# Patient Record
Sex: Male | Born: 1955 | Race: White | Hispanic: No | Marital: Married | Smoking: Never smoker
Health system: Southern US, Community
[De-identification: ages and names within clinical notes are randomized; demographics above are authoritative.]

## PROBLEM LIST (undated history)

## (undated) DIAGNOSIS — K219 Gastro-esophageal reflux disease without esophagitis: Secondary | ICD-10-CM

## (undated) DIAGNOSIS — M1990 Unspecified osteoarthritis, unspecified site: Secondary | ICD-10-CM

## (undated) DIAGNOSIS — K4090 Unilateral inguinal hernia, without obstruction or gangrene, not specified as recurrent: Secondary | ICD-10-CM

## (undated) DIAGNOSIS — Z973 Presence of spectacles and contact lenses: Secondary | ICD-10-CM

## (undated) HISTORY — PX: INGUINAL HERNIA REPAIR: SUR1180

---

## 1999-11-23 ENCOUNTER — Encounter: Payer: Self-pay | Admitting: Family Medicine

## 1999-11-23 ENCOUNTER — Ambulatory Visit (HOSPITAL_COMMUNITY): Admission: RE | Admit: 1999-11-23 | Discharge: 1999-11-23 | Payer: Self-pay | Admitting: Family Medicine

## 2015-06-24 NOTE — H&P (Signed)
TOTAL HIP ADMISSION H&P  Patient is admitted for left total hip arthroplasty, anterior approach.  Subjective:  Chief Complaint:    Left hip primary OA / pain  HPI: Dale Wagner, 60 y.o. male, has a history of pain and functional disability in the left hip(s) due to arthritis and patient has failed non-surgical conservative treatments for greater than 12 weeks to include NSAID's and/or analgesics and activity modification.  Onset of symptoms was gradual starting ~2 years ago with gradually worsening course since that time.The patient noted no past surgery on the left hip(s).  Patient currently rates pain in the left hip at 10 out of 10 with activity. Patient has night pain, worsening of pain with activity and weight bearing, trendelenberg gait, pain that interfers with activities of daily living and pain with passive range of motion. Patient has evidence of periarticular osteophytes and joint space narrowing by imaging studies. This condition presents safety issues increasing the risk of falls.   There is no current active infection.   Risks, benefits and expectations were discussed with the patient.  Risks including but not limited to the risk of anesthesia, blood clots, nerve damage, blood vessel damage, failure of the prosthesis, infection and up to and including death.  Patient understand the risks, benefits and expectations and wishes to proceed with surgery.   PCP: Lolita Patella, MD  D/C Plans:      Home with HHPT  Post-op Meds:       No Rx given  Tranexamic Acid:      To be given - IV   Decadron:      Is to be given  FYI:     ASA  Norco    Past Medical History  Diagnosis Date  . Arthritis   . GERD (gastroesophageal reflux disease)     Past Surgical History  Procedure Laterality Date  . Hernia repair  2000    No prescriptions prior to admission   No Known Allergies   Social History  Substance Use Topics  . Smoking status: Never Smoker   . Smokeless tobacco:  Not on file  . Alcohol Use: No       Review of Systems  Constitutional: Negative.   HENT: Negative.   Eyes: Negative.   Respiratory: Negative.   Cardiovascular: Negative.   Gastrointestinal: Positive for heartburn.  Genitourinary: Positive for frequency.  Musculoskeletal: Positive for joint pain.  Skin: Negative.   Neurological: Negative.   Endo/Heme/Allergies: Negative.   Psychiatric/Behavioral: Negative.     Objective:  Physical Exam  Constitutional: He is oriented to person, place, and time. He appears well-developed.  HENT:  Head: Normocephalic.  Eyes: Pupils are equal, round, and reactive to light.  Neck: Neck supple. No JVD present. No tracheal deviation present. No thyromegaly present.  Cardiovascular: Normal rate, regular rhythm, normal heart sounds and intact distal pulses.   Respiratory: Effort normal and breath sounds normal. No stridor. No respiratory distress. He has no wheezes.  GI: Soft. There is no tenderness. There is no guarding.  Musculoskeletal:       Left hip: He exhibits decreased range of motion, decreased strength, tenderness and bony tenderness. He exhibits no swelling, no deformity and no laceration.  Lymphadenopathy:    He has no cervical adenopathy.  Neurological: He is alert and oriented to person, place, and time.  Skin: Skin is warm and dry.  Psychiatric: He has a normal mood and affect.      Imaging Review Plain radiographs demonstrate severe  degenerative joint disease of the left hip(s). The bone quality appears to be good for age and reported activity level.  Assessment/Plan:  End stage arthritis, left hip(s)  The patient history, physical examination, clinical judgement of the provider and imaging studies are consistent with end stage degenerative joint disease of the left hip(s) and total hip arthroplasty is deemed medically necessary. The treatment options including medical management, injection therapy, arthroscopy and  arthroplasty were discussed at length. The risks and benefits of total hip arthroplasty were presented and reviewed. The risks due to aseptic loosening, infection, stiffness, dislocation/subluxation,  thromboembolic complications and other imponderables were discussed.  The patient acknowledged the explanation, agreed to proceed with the plan and consent was signed. Patient is being admitted for inpatient treatment for surgery, pain control, PT, OT, prophylactic antibiotics, VTE prophylaxis, progressive ambulation and ADL's and discharge planning.The patient is planning to be discharged home with home health services.      Anastasio Auerbach [place] Paiz   PA-C  07/04/2015, 2:58 PM

## 2015-06-30 NOTE — Patient Instructions (Addendum)
YOUR PROCEDURE IS SCHEDULED ON :  07/12/15  REPORT TO [facility] MAIN ENTRANCE FOLLOW SIGNS TO EAST ELEVATOR - GO TO 3rd FLOOR CHECK IN AT 3 EAST NURSES STATION (SHORT STAY) AT:  5:45 AM  CALL THIS NUMBER IF YOU HAVE PROBLEMS THE MORNING OF SURGERY [phone]  REMEMBER:ONLY 1 PER PERSON MAY GO TO SHORT STAY WITH YOU TO GET READY THE MORNING OF YOUR SURGERY  DO NOT EAT FOOD OR DRINK LIQUIDS AFTER MIDNIGHT  TAKE THESE MEDICINES THE MORNING OF SURGERY: NONE  YOU MAY NOT HAVE ANY METAL ON YOUR BODY INCLUDING HAIR PINS AND PIERCING'S. DO NOT WEAR JEWELRY, MAKEUP, LOTIONS, POWDERS OR PERFUMES. DO NOT WEAR NAIL POLISH. DO NOT SHAVE 48 HRS PRIOR TO SURGERY. MEN MAY SHAVE FACE AND NECK.  DO NOT BRING VALUABLES TO HOSPITAL. [name] IS NOT RESPONSIBLE FOR VALUABLES.  CONTACTS, DENTURES OR PARTIALS MAY NOT BE WORN TO SURGERY. LEAVE SUITCASE IN CAR. CAN BE BROUGHT TO ROOM AFTER SURGERY.  PATIENTS DISCHARGED THE DAY OF SURGERY WILL NOT BE ALLOWED TO DRIVE HOME.  PLEASE READ OVER THE FOLLOWING INSTRUCTION SHEETS _________________________________________________________________________________                                          [name] - PREPARING FOR SURGERY  Before surgery, you can play an important role.  Because skin is not sterile, your skin needs to be as free of germs as possible.  You can reduce the number of germs on your skin by washing with CHG (chlorahexidine gluconate) soap before surgery.  CHG is an antiseptic cleaner which kills germs and bonds with the skin to continue killing germs even after washing. Please DO NOT use if you have an allergy to CHG or antibacterial soaps.  If your skin becomes reddened/irritated stop using the CHG and inform your nurse when you arrive at Short Stay. Do not shave (including legs and underarms) for at least 48 hours prior to the first CHG shower.  You may shave your face. Please follow these instructions  carefully:   1.  Shower with CHG Soap the night before surgery and the  morning of Surgery.   2.  If you choose to wash your hair, wash your hair first as usual with your  normal  Shampoo.   3.  After you shampoo, rinse your hair and body thoroughly to remove the  shampoo.                                         4.  Use CHG as you would any other liquid soap.  You can apply chg directly  to the skin and wash . Gently wash with scrungie or clean wascloth    5.  Apply the CHG Soap to your body ONLY FROM THE NECK DOWN.   Do not use on open                           Wound or open sores. Avoid contact with eyes, ears mouth and genitals (private parts).                        Genitals (private parts) with your normal soap.  6.  Wash thoroughly, paying special attention to the area where your surgery  will be performed.   7.  Thoroughly rinse your body with warm water from the neck down.   8.  DO NOT shower/wash with your normal soap after using and rinsing off  the CHG Soap .                9.  Pat yourself dry with a clean towel.             10.  Wear clean night clothes to bed after shower             11.  Place clean sheets on your bed the night of your first shower and do not  sleep with pets.  Day of Surgery : Do not apply any lotions/deodorants the morning of surgery.  Please wear clean clothes to the hospital/surgery center.  FAILURE TO FOLLOW THESE INSTRUCTIONS MAY RESULT IN THE CANCELLATION OF YOUR SURGERY    PATIENT SIGNATURE_________________________________  ______________________________________________________________________     Dale Wagner  An incentive spirometer is a tool that can help keep your lungs clear and active. This tool measures how well you are filling your lungs with each breath. Taking long deep breaths may help reverse or decrease the chance of developing breathing (pulmonary) problems (especially infection) following:  A long  period of time when you are unable to move or be active. BEFORE THE PROCEDURE   If the spirometer includes an indicator to show your best effort, your nurse or respiratory therapist will set it to a desired goal.  If possible, sit up straight or lean slightly forward. Try not to slouch.  Hold the incentive spirometer in an upright position. INSTRUCTIONS FOR USE   Sit on the edge of your bed if possible, or sit up as far as you can in bed or on a chair.  Hold the incentive spirometer in an upright position.  Breathe out normally.  Place the mouthpiece in your mouth and seal your lips tightly around it.  Breathe in slowly and as deeply as possible, raising the piston or the ball toward the top of the column.  Hold your breath for 3-5 seconds or for as long as possible. Allow the piston or ball to fall to the bottom of the column.  Remove the mouthpiece from your mouth and breathe out normally.  Rest for a few seconds and repeat Steps 1 through 7 at least 10 times every 1-2 hours when you are awake. Take your time and take a few normal breaths between deep breaths.  The spirometer may include an indicator to show your best effort. Use the indicator as a goal to work toward during each repetition.  After each set of 10 deep breaths, practice coughing to be sure your lungs are clear. If you have an incision (the cut made at the time of surgery), support your incision when coughing by placing a pillow or rolled up towels firmly against it. Once you are able to get out of bed, walk around indoors and cough well. You may stop using the incentive spirometer when instructed by your caregiver.  RISKS AND COMPLICATIONS  Take your time so you do not get dizzy or light-headed.  If you are in pain, you may need to take or ask for pain medication before doing incentive spirometry. It is harder to take a deep breath if you are having pain. AFTER USE  Rest and breathe slowly and easily.  It can  be helpful to keep track of a log of your progress. Your caregiver can provide you with a simple table to help with this. If you are using the spirometer at home, follow these instructions: Dale Wagner IF:   You are having difficultly using the spirometer.  You have trouble using the spirometer as often as instructed.  Your pain medication is not giving enough relief while using the spirometer.  You develop fever of 100.5 F (38.1 C) or higher. SEEK IMMEDIATE MEDICAL CARE IF:   You cough up bloody sputum that had not been present before.  You develop fever of 102 F (38.9 C) or greater.  You develop worsening pain at or near the incision site. MAKE SURE YOU:   Understand these instructions.  Will watch your condition.  Will get help right away if you are not doing well or get worse. Document Released: 10/01/2006 Document Revised: 08/13/2011 Document Reviewed: 12/02/2006 [employer] Patient Information 2014 [employer], [place].   ________________________________________________________________________  WHAT IS A BLOOD TRANSFUSION? Blood Transfusion Information  A transfusion is the replacement of blood or some of its parts. Blood is made up of multiple cells which provide different functions.  Red blood cells carry oxygen and are used for blood loss replacement.  White blood cells fight against infection.  Platelets control bleeding.  Plasma helps clot blood.  Other blood products are available for specialized needs, such as hemophilia or other clotting disorders. BEFORE THE TRANSFUSION  Who gives blood for transfusions?   Healthy volunteers who are fully evaluated to make sure their blood is safe. This is blood bank blood. Transfusion therapy is the safest it has ever been in the practice of medicine. Before blood is taken from a donor, a complete history is taken to make sure that person has no history of diseases nor engages in risky social behavior (examples are  intravenous drug use or sexual activity with multiple partners). The donor's travel history is screened to minimize risk of transmitting infections, such as malaria. The donated blood is tested for signs of infectious diseases, such as HIV and hepatitis. The blood is then tested to be sure it is compatible with you in order to minimize the chance of a transfusion reaction. If you or a relative donates blood, this is often done in anticipation of surgery and is not appropriate for emergency situations. It takes many days to process the donated blood. RISKS AND COMPLICATIONS Although transfusion therapy is very safe and saves many lives, the main dangers of transfusion include:   Getting an infectious disease.  Developing a transfusion reaction. This is an allergic reaction to something in the blood you were given. Every precaution is taken to prevent this. The decision to have a blood transfusion has been considered carefully by your caregiver before blood is given. Blood is not given unless the benefits outweigh the risks. AFTER THE TRANSFUSION  Right after receiving a blood transfusion, you will usually feel much better and more energetic. This is especially true if your red blood cells have gotten low (anemic). The transfusion raises the level of the red blood cells which carry oxygen, and this usually causes an energy increase.  The nurse administering the transfusion will monitor you carefully for complications. HOME CARE INSTRUCTIONS  No special instructions are needed after a transfusion. You may find your energy is better. Speak with your caregiver about any limitations on activity for underlying diseases you may have. SEEK MEDICAL CARE IF:   Your  condition is not improving after your transfusion.  You develop redness or irritation at the intravenous (IV) site. SEEK IMMEDIATE MEDICAL CARE IF:  Any of the following symptoms occur over the next 12 hours:  Shaking chills.  You have a  temperature by mouth above 102 F (38.9 C), not controlled by medicine.  Chest, back, or muscle pain.  People around you feel you are not acting correctly or are confused.  Shortness of breath or difficulty breathing.  Dizziness and fainting.  You get a rash or develop hives.  You have a decrease in urine output.  Your urine turns a dark color or changes to pink, red, or brown. Any of the following symptoms occur over the next 10 days:  You have a temperature by mouth above 102 F (38.9 C), not controlled by medicine.  Shortness of breath.  Weakness after normal activity.  The white part of the eye turns yellow (jaundice).  You have a decrease in the amount of urine or are urinating less often.  Your urine turns a dark color or changes to pink, red, or brown. Document Released: 05/18/2000 Document Revised: 08/13/2011 Document Reviewed: 01/05/2008 [facility] Patient Information 2014 [place], [place].  _______________________________________________________________________

## 2015-07-04 ENCOUNTER — Encounter (HOSPITAL_COMMUNITY)
Admission: RE | Admit: 2015-07-04 | Discharge: 2015-07-04 | Disposition: A | Discharge status: Home / Self Care | Payer: Commercial Managed Care - PPO | Source: Ambulatory Visit | Attending: Orthopedic Surgery | Admitting: Orthopedic Surgery

## 2015-07-04 ENCOUNTER — Encounter (HOSPITAL_COMMUNITY): Payer: Self-pay

## 2015-07-04 DIAGNOSIS — Z01812 Encounter for preprocedural laboratory examination: Secondary | ICD-10-CM | POA: Diagnosis present

## 2015-07-04 DIAGNOSIS — M1612 Unilateral primary osteoarthritis, left hip: Secondary | ICD-10-CM | POA: Diagnosis not present

## 2015-07-04 DIAGNOSIS — Z0183 Encounter for blood typing: Secondary | ICD-10-CM | POA: Diagnosis not present

## 2015-07-04 HISTORY — DX: Gastro-esophageal reflux disease without esophagitis: K21.9

## 2015-07-04 HISTORY — DX: Unspecified osteoarthritis, unspecified site: M19.90

## 2015-07-04 LAB — CBC
HCT: 41 % (ref 39.0–52.0)
Hemoglobin: 13.7 g/dL (ref 13.0–17.0)
MCH: 30.4 pg (ref 26.0–34.0)
MCHC: 33.4 g/dL (ref 30.0–36.0)
MCV: 91.1 fL (ref 78.0–100.0)
PLATELETS: 167 10*3/uL (ref 150–400)
RBC: 4.5 MIL/uL (ref 4.22–5.81)
RDW: 12.8 % (ref 11.5–15.5)
WBC: 6.3 10*3/uL (ref 4.0–10.5)

## 2015-07-04 LAB — BASIC METABOLIC PANEL
Anion gap: 7 (ref 5–15)
BUN: 20 mg/dL (ref 6–20)
CALCIUM: 9 mg/dL (ref 8.9–10.3)
CHLORIDE: 107 mmol/L (ref 101–111)
CO2: 26 mmol/L (ref 22–32)
CREATININE: 0.96 mg/dL (ref 0.61–1.24)
GFR calc non Af Amer: >60 mL/min (ref >60)
Glucose, Bld: 86 mg/dL (ref 65–99)
Potassium: 4.6 mmol/L (ref 3.5–5.1)
SODIUM: 140 mmol/L (ref 135–145)

## 2015-07-04 LAB — URINE MICROSCOPIC-ADD ON: WBC UA: NONE SEEN WBC/hpf (ref 0–5)

## 2015-07-04 LAB — URINALYSIS, ROUTINE W REFLEX MICROSCOPIC
Bilirubin Urine: NEGATIVE
Glucose, UA: NEGATIVE mg/dL
Ketones, ur: NEGATIVE mg/dL
Leukocytes, UA: NEGATIVE
NITRITE: NEGATIVE
PH: 5.5 (ref 5.0–8.0)
Protein, ur: NEGATIVE mg/dL
SPECIFIC GRAVITY, URINE: 1.009 (ref 1.005–1.030)

## 2015-07-04 LAB — SURGICAL PCR SCREEN
MRSA, PCR: NEGATIVE
STAPHYLOCOCCUS AUREUS: NEGATIVE

## 2015-07-04 LAB — PROTIME-INR
INR: 1.11 (ref 0.00–1.49)
PROTHROMBIN TIME: 14.1 s (ref 11.6–15.2)

## 2015-07-04 LAB — APTT: aPTT: 31 seconds (ref 24–37)

## 2015-07-04 LAB — ABO/RH: ABO/RH(D): A POS

## 2015-07-12 ENCOUNTER — Encounter (HOSPITAL_COMMUNITY)
Admission: RE | Disposition: A | Discharge status: Home Health | Payer: Self-pay | Source: Ambulatory Visit | Attending: Orthopedic Surgery

## 2015-07-12 ENCOUNTER — Inpatient Hospital Stay (HOSPITAL_COMMUNITY): Payer: Commercial Managed Care - PPO

## 2015-07-12 ENCOUNTER — Encounter (HOSPITAL_COMMUNITY): Payer: Self-pay | Admitting: Registered Nurse

## 2015-07-12 ENCOUNTER — Inpatient Hospital Stay (HOSPITAL_COMMUNITY): Payer: Commercial Managed Care - PPO | Admitting: Registered Nurse

## 2015-07-12 ENCOUNTER — Inpatient Hospital Stay (HOSPITAL_COMMUNITY)
Admission: RE | Admit: 2015-07-12 | Discharge: 2015-07-13 | DRG: 470 | Disposition: A | Discharge status: Home Health | Payer: Commercial Managed Care - PPO | Source: Ambulatory Visit | Attending: Orthopedic Surgery | Admitting: Orthopedic Surgery

## 2015-07-12 DIAGNOSIS — Z6834 Body mass index (BMI) 34.0-34.9, adult: Secondary | ICD-10-CM

## 2015-07-12 DIAGNOSIS — M1612 Unilateral primary osteoarthritis, left hip: Secondary | ICD-10-CM | POA: Diagnosis present

## 2015-07-12 DIAGNOSIS — Z96649 Presence of unspecified artificial hip joint: Secondary | ICD-10-CM

## 2015-07-12 DIAGNOSIS — E669 Obesity, unspecified: Secondary | ICD-10-CM | POA: Diagnosis present

## 2015-07-12 DIAGNOSIS — K219 Gastro-esophageal reflux disease without esophagitis: Secondary | ICD-10-CM | POA: Diagnosis present

## 2015-07-12 DIAGNOSIS — Z01812 Encounter for preprocedural laboratory examination: Secondary | ICD-10-CM | POA: Diagnosis not present

## 2015-07-12 DIAGNOSIS — M25552 Pain in left hip: Secondary | ICD-10-CM | POA: Diagnosis present

## 2015-07-12 HISTORY — PX: TOTAL HIP ARTHROPLASTY: SHX124

## 2015-07-12 LAB — TYPE AND SCREEN
ABO/RH(D): A POS
ANTIBODY SCREEN: NEGATIVE

## 2015-07-12 SURGERY — ARTHROPLASTY, HIP, TOTAL, ANTERIOR APPROACH
Anesthesia: Spinal | Site: Hip | Laterality: Left

## 2015-07-12 MED ORDER — ALUM & MAG HYDROXIDE-SIMETH 200-200-20 MG/5ML PO SUSP: 30.0000 mL | ORAL | Status: DC | PRN

## 2015-07-12 MED ORDER — MIDAZOLAM HCL 5 MG/5ML IJ SOLN
INTRAMUSCULAR | Status: DC | PRN
  Administered 2015-07-12: 2 mg via INTRAVENOUS

## 2015-07-12 MED ORDER — CELECOXIB 200 MG PO CAPS
200.0000 mg | ORAL_CAPSULE | Freq: Two times a day (BID) | ORAL | Status: DC
  Administered 2015-07-12 – 2015-07-13 (×3): 200 mg via ORAL
  Filled 2015-07-12 (×4): qty 1

## 2015-07-12 MED ORDER — FERROUS SULFATE 325 (65 FE) MG PO TABS
325.0000 mg | ORAL_TABLET | Freq: Three times a day (TID) | ORAL | Status: DC
  Administered 2015-07-12 – 2015-07-13 (×4): 325 mg via ORAL
  Filled 2015-07-12 (×6): qty 1

## 2015-07-12 MED ORDER — PROPOFOL 10 MG/ML IV BOLUS
INTRAVENOUS | Status: DC | PRN
  Administered 2015-07-12: 30 mg via INTRAVENOUS

## 2015-07-12 MED ORDER — STERILE WATER FOR IRRIGATION IR SOLN
Status: DC | PRN
  Administered 2015-07-12: 1000 mL

## 2015-07-12 MED ORDER — SODIUM CHLORIDE 0.9 % IR SOLN
Status: DC | PRN
  Administered 2015-07-12: 1000 mL

## 2015-07-12 MED ORDER — MAGNESIUM CITRATE PO SOLN
1.0000 | Freq: Once | ORAL | Status: DC | PRN
Start: 2015-07-12 — End: 2015-07-13

## 2015-07-12 MED ORDER — CEFAZOLIN SODIUM-DEXTROSE 2-3 GM-% IV SOLR
2.0000 g | INTRAVENOUS | Status: AC
  Administered 2015-07-12: 2 g via INTRAVENOUS

## 2015-07-12 MED ORDER — PROPOFOL 500 MG/50ML IV EMUL
INTRAVENOUS | Status: DC | PRN
  Administered 2015-07-12: 100 ug/kg/min via INTRAVENOUS

## 2015-07-12 MED ORDER — HYDROMORPHONE HCL 1 MG/ML IJ SOLN: 0.2500 mg | INTRAMUSCULAR | Status: DC | PRN

## 2015-07-12 MED ORDER — BUPIVACAINE IN DEXTROSE 0.75-8.25 % IT SOLN
INTRATHECAL | Status: DC | PRN
  Administered 2015-07-12: 2 mL via INTRATHECAL

## 2015-07-12 MED ORDER — OXYCODONE HCL 5 MG PO TABS: 5.0000 mg | ORAL_TABLET | Freq: Once | ORAL | Status: DC | PRN

## 2015-07-12 MED ORDER — ONDANSETRON HCL 4 MG/2ML IJ SOLN: 4.0000 mg | Freq: Four times a day (QID) | INTRAMUSCULAR | Status: DC | PRN

## 2015-07-12 MED ORDER — DOCUSATE SODIUM 100 MG PO CAPS
100.0000 mg | ORAL_CAPSULE | Freq: Two times a day (BID) | ORAL | Status: DC
  Administered 2015-07-12 – 2015-07-13 (×2): 100 mg via ORAL

## 2015-07-12 MED ORDER — HYDROMORPHONE HCL 1 MG/ML IJ SOLN
0.5000 mg | INTRAMUSCULAR | Status: DC | PRN
  Administered 2015-07-12 (×2): 0.5 mg via INTRAVENOUS
  Administered 2015-07-12: 1 mg via INTRAVENOUS
  Filled 2015-07-12 (×2): qty 1

## 2015-07-12 MED ORDER — METOCLOPRAMIDE HCL 10 MG PO TABS: 5.0000 mg | ORAL_TABLET | Freq: Three times a day (TID) | ORAL | Status: DC | PRN

## 2015-07-12 MED ORDER — METHOCARBAMOL 1000 MG/10ML IJ SOLN
500.0000 mg | Freq: Four times a day (QID) | INTRAVENOUS | Status: DC | PRN
  Administered 2015-07-12: 500 mg via INTRAVENOUS
  Filled 2015-07-12 (×2): qty 5

## 2015-07-12 MED ORDER — ASPIRIN EC 325 MG PO TBEC
325.0000 mg | DELAYED_RELEASE_TABLET | Freq: Two times a day (BID) | ORAL | Status: DC
  Administered 2015-07-13: 325 mg via ORAL
  Filled 2015-07-12 (×3): qty 1

## 2015-07-12 MED ORDER — FENTANYL CITRATE (PF) 100 MCG/2ML IJ SOLN
INTRAMUSCULAR | Status: AC
  Filled 2015-07-12: qty 2

## 2015-07-12 MED ORDER — DIPHENHYDRAMINE HCL 25 MG PO CAPS
25.0000 mg | ORAL_CAPSULE | Freq: Four times a day (QID) | ORAL | Status: DC | PRN
Start: 2015-07-12 — End: 2015-07-13

## 2015-07-12 MED ORDER — PROPOFOL 10 MG/ML IV BOLUS
INTRAVENOUS | Status: AC
  Filled 2015-07-12: qty 60

## 2015-07-12 MED ORDER — DEXAMETHASONE SODIUM PHOSPHATE 10 MG/ML IJ SOLN
10.0000 mg | Freq: Once | INTRAMUSCULAR | Status: AC
  Administered 2015-07-12: 10 mg via INTRAVENOUS

## 2015-07-12 MED ORDER — SODIUM CHLORIDE 0.9 % IV SOLN
100.0000 mL/h | INTRAVENOUS | Status: DC
  Administered 2015-07-12: 100 mL/h via INTRAVENOUS
  Filled 2015-07-12 (×5): qty 1000

## 2015-07-12 MED ORDER — PHENYLEPHRINE 40 MCG/ML (10ML) SYRINGE FOR IV PUSH (FOR BLOOD PRESSURE SUPPORT)
PREFILLED_SYRINGE | INTRAVENOUS | Status: AC
  Filled 2015-07-12: qty 10

## 2015-07-12 MED ORDER — LACTATED RINGERS IV SOLN
INTRAVENOUS | Status: DC | PRN
  Administered 2015-07-12 (×3): via INTRAVENOUS

## 2015-07-12 MED ORDER — PROPOFOL 10 MG/ML IV BOLUS
INTRAVENOUS | Status: AC
  Filled 2015-07-12: qty 20

## 2015-07-12 MED ORDER — PHENOL 1.4 % MT LIQD
1.0000 | OROMUCOSAL | Status: DC | PRN
Start: 2015-07-12 — End: 2015-07-13
  Filled 2015-07-12: qty 177

## 2015-07-12 MED ORDER — CHLORHEXIDINE GLUCONATE 4 % EX LIQD: 60.0000 mL | Freq: Once | CUTANEOUS | Status: DC

## 2015-07-12 MED ORDER — CEFAZOLIN SODIUM-DEXTROSE 2-3 GM-% IV SOLR
2.0000 g | Freq: Four times a day (QID) | INTRAVENOUS | Status: AC
  Administered 2015-07-12 (×2): 2 g via INTRAVENOUS
  Filled 2015-07-12 (×2): qty 50

## 2015-07-12 MED ORDER — BISACODYL 10 MG RE SUPP: 10.0000 mg | Freq: Every day | RECTAL | Status: DC | PRN

## 2015-07-12 MED ORDER — PHENYLEPHRINE HCL 10 MG/ML IJ SOLN
10.0000 mg | INTRAVENOUS | Status: DC | PRN
  Administered 2015-07-12: 40 ug/min via INTRAVENOUS

## 2015-07-12 MED ORDER — CEFAZOLIN SODIUM-DEXTROSE 2-3 GM-% IV SOLR
INTRAVENOUS | Status: AC
  Filled 2015-07-12: qty 50

## 2015-07-12 MED ORDER — METOCLOPRAMIDE HCL 5 MG/ML IJ SOLN: 5.0000 mg | Freq: Three times a day (TID) | INTRAMUSCULAR | Status: DC | PRN

## 2015-07-12 MED ORDER — PHENYLEPHRINE HCL 10 MG/ML IJ SOLN
INTRAMUSCULAR | Status: AC
  Filled 2015-07-12: qty 1

## 2015-07-12 MED ORDER — FENTANYL CITRATE (PF) 100 MCG/2ML IJ SOLN
INTRAMUSCULAR | Status: DC | PRN
  Administered 2015-07-12: 100 ug via INTRAVENOUS

## 2015-07-12 MED ORDER — OXYCODONE HCL 5 MG/5ML PO SOLN: 5.0000 mg | Freq: Once | ORAL | Status: DC | PRN

## 2015-07-12 MED ORDER — ONDANSETRON HCL 4 MG/2ML IJ SOLN
INTRAMUSCULAR | Status: DC | PRN
  Administered 2015-07-12: 4 mg via INTRAVENOUS

## 2015-07-12 MED ORDER — PHENYLEPHRINE HCL 10 MG/ML IJ SOLN
INTRAMUSCULAR | Status: DC | PRN
  Administered 2015-07-12 (×3): 80 ug via INTRAVENOUS

## 2015-07-12 MED ORDER — HYDROCODONE-ACETAMINOPHEN 7.5-325 MG PO TABS
1.0000 | ORAL_TABLET | ORAL | Status: DC
  Administered 2015-07-12 – 2015-07-13 (×6): 2 via ORAL
  Filled 2015-07-12 (×6): qty 2

## 2015-07-12 MED ORDER — TRANEXAMIC ACID 1000 MG/10ML IV SOLN
1000.0000 mg | Freq: Once | INTRAVENOUS | Status: AC
  Administered 2015-07-12: 1000 mg via INTRAVENOUS
  Filled 2015-07-12: qty 10

## 2015-07-12 MED ORDER — METHOCARBAMOL 500 MG PO TABS: 500.0000 mg | ORAL_TABLET | Freq: Four times a day (QID) | ORAL | Status: DC | PRN

## 2015-07-12 MED ORDER — MIDAZOLAM HCL 2 MG/2ML IJ SOLN
INTRAMUSCULAR | Status: AC
  Filled 2015-07-12: qty 2

## 2015-07-12 MED ORDER — ONDANSETRON HCL 4 MG PO TABS: 4.0000 mg | ORAL_TABLET | Freq: Four times a day (QID) | ORAL | Status: DC | PRN

## 2015-07-12 MED ORDER — POLYETHYLENE GLYCOL 3350 17 G PO PACK
17.0000 g | PACK | Freq: Two times a day (BID) | ORAL | Status: DC
  Administered 2015-07-12 – 2015-07-13 (×3): 17 g via ORAL

## 2015-07-12 MED ORDER — MENTHOL 3 MG MT LOZG: 1.0000 | LOZENGE | OROMUCOSAL | Status: DC | PRN

## 2015-07-12 MED ORDER — MEPERIDINE HCL 50 MG/ML IJ SOLN: 6.2500 mg | INTRAMUSCULAR | Status: DC | PRN

## 2015-07-12 MED ORDER — DEXAMETHASONE SODIUM PHOSPHATE 10 MG/ML IJ SOLN
10.0000 mg | Freq: Once | INTRAMUSCULAR | Status: AC
  Administered 2015-07-13: 10 mg via INTRAVENOUS
  Filled 2015-07-12: qty 1

## 2015-07-12 SURGICAL SUPPLY — 35 items
BAG DECANTER FOR FLEXI CONT (MISCELLANEOUS) IMPLANT
BAG SPEC THK2 15X12 ZIP CLS (MISCELLANEOUS)
BAG ZIPLOCK 12X15 (MISCELLANEOUS) IMPLANT
CAPT HIP TOTAL 2 ×1 IMPLANT
CLOTH BEACON ORANGE TIMEOUT ST (SAFETY) ×2 IMPLANT
COVER PERINEAL POST (MISCELLANEOUS) ×2 IMPLANT
DRAPE STERI IOBAN 125X83 (DRAPES) ×2 IMPLANT
DRAPE U-SHAPE 47X51 STRL (DRAPES) ×4 IMPLANT
DRSG AQUACEL AG ADV 3.5X10 (GAUZE/BANDAGES/DRESSINGS) ×2 IMPLANT
DURAPREP 26ML APPLICATOR (WOUND CARE) ×2 IMPLANT
ELECT REM PT RETURN 15FT ADLT (MISCELLANEOUS) IMPLANT
ELECT REM PT RETURN 9FT ADLT (ELECTROSURGICAL) ×2
ELECTRODE REM PT RTRN 9FT ADLT (ELECTROSURGICAL) ×1 IMPLANT
GLOVE BIOGEL M STRL SZ7.5 (GLOVE) ×2 IMPLANT
GLOVE BIOGEL PI IND STRL 7.5 (GLOVE) ×1 IMPLANT
GLOVE BIOGEL PI IND STRL 8.5 (GLOVE) ×1 IMPLANT
GLOVE BIOGEL PI INDICATOR 7.5 (GLOVE) ×3
GLOVE BIOGEL PI INDICATOR 8.5 (GLOVE) ×1
GLOVE ECLIPSE 8.0 STRL XLNG CF (GLOVE) ×4 IMPLANT
GLOVE ORTHO TXT STRL SZ7.5 (GLOVE) ×2 IMPLANT
GLOVE SURG SS PI 7.5 STRL IVOR (GLOVE) ×3 IMPLANT
GOWN STRL REUS W/TWL LRG LVL3 (GOWN DISPOSABLE) ×2 IMPLANT
GOWN STRL REUS W/TWL XL LVL3 (GOWN DISPOSABLE) ×4 IMPLANT
HOLDER FOLEY CATH W/STRAP (MISCELLANEOUS) ×2 IMPLANT
LIQUID BAND (GAUZE/BANDAGES/DRESSINGS) ×2 IMPLANT
PACK ANTERIOR HIP CUSTOM (KITS) ×2 IMPLANT
SAW OSC TIP CART 19.5X105X1.3 (SAW) ×2 IMPLANT
SUT MNCRL AB 4-0 PS2 18 (SUTURE) ×2 IMPLANT
SUT VIC AB 1 CT1 36 (SUTURE) ×6 IMPLANT
SUT VIC AB 2-0 CT1 27 (SUTURE) ×4
SUT VIC AB 2-0 CT1 TAPERPNT 27 (SUTURE) ×2 IMPLANT
SUT VLOC 180 0 24IN GS25 (SUTURE) ×2 IMPLANT
TRAY FOLEY W/METER SILVER 14FR (SET/KITS/TRAYS/PACK) ×1 IMPLANT
WATER STERILE IRR 1500ML POUR (IV SOLUTION) ×2 IMPLANT
YANKAUER SUCT BULB TIP 10FT TU (MISCELLANEOUS) ×1 IMPLANT

## 2015-07-12 NOTE — Progress Notes (Signed)
Utilization review completed.  

## 2015-07-12 NOTE — Op Note (Signed)
NAME:  Dale Wagner                ACCOUNT NO.: [id number]      MEDICAL RECORD NO.: [id number]      FACILITY:  [facility]      PHYSICIAN:  Durene Romans D  DATE OF BIRTH:  11-May-1956     DATE OF PROCEDURE:  07/12/2015                                 OPERATIVE REPORT         PREOPERATIVE DIAGNOSIS: Left  hip osteoarthritis.      POSTOPERATIVE DIAGNOSIS:  Left hip osteoarthritis.      PROCEDURE:  Left total hip replacement through an anterior approach   utilizing DePuy THR system, component size 56mm pinnacle cup, a size 36+4 neutral   Altrex liner, a size 9 Hi Tri Lock stem with a 36+5 delta ceramic   ball.      SURGEON:  Madlyn Frankel. Charlann Boxer, M.D.      ASSISTANT:  Skip Mayer, PA-C      ANESTHESIA:  Spinal.      SPECIMENS:  None.      COMPLICATIONS:  None.      BLOOD LOSS:  450 cc     DRAINS:  None      INDICATION OF THE PROCEDURE:  Dale Wagner is a 60 y.o. male who had   presented to office for evaluation of left hip pain.  Radiographs revealed   progressive degenerative changes with bone-on-bone   articulation to the  hip joint.  The patient had painful limited range of   motion significantly affecting their overall quality of life.  The patient was failing to    respond to conservative measures, and at this point was ready   to proceed with more definitive measures.  The patient has noted progressive   degenerative changes in his hip, progressive problems and dysfunction   with regarding the hip prior to surgery.  Consent was obtained for   benefit of pain relief.  Specific risk of infection, DVT, component   failure, dislocation, need for revision surgery, as well discussion of   the anterior versus posterior approach were reviewed.  Consent was   obtained for benefit of anterior pain relief through an anterior   approach.      PROCEDURE IN DETAIL:  The patient was brought to operative theater.   Once adequate anesthesia, preoperative  antibiotics, 2gm of Ancef, 1 gm of Tranexamic Acid, and 10 mg of Decadron administered.   The patient was positioned supine on the OSI Hanna table.  Once adequate   padding of boney process was carried out, we had predraped out the hip, and  used fluoroscopy to confirm orientation of the pelvis and position.      The left hip was then prepped and draped from proximal iliac crest to   mid thigh with shower curtain technique.      Time-out was performed identifying the patient, planned procedure, and   extremity.     An incision was then made 2 cm distal and lateral to the   anterior superior iliac spine extending over the orientation of the   tensor fascia lata muscle and sharp dissection was carried down to the   fascia of the muscle and protractor placed in the soft tissues.      The fascia  was then incised.  The muscle belly was identified and swept   laterally and retractor placed along the superior neck.  Following   cauterization of the circumflex vessels and removing some pericapsular   fat, a second cobra retractor was placed on the inferior neck.  A third   retractor was placed on the anterior acetabulum after elevating the   anterior rectus.  A L-capsulotomy was along the line of the   superior neck to the trochanteric fossa, then extended proximally and   distally.  Tag sutures were placed and the retractors were then placed   intracapsular.  We then identified the trochanteric fossa and   orientation of my neck cut, confirmed this radiographically   and then made a neck osteotomy with the femur on traction.  The femoral   head was removed without difficulty or complication.  Traction was let   off and retractors were placed posterior and anterior around the   acetabulum.      The labrum and foveal tissue were debrided.  I began reaming with a 47mm   reamer and reamed up to 55mm reamer with good bony bed preparation and a 56mm   cup was chosen.  The final 56mm Pinnacle cup  was then impacted under fluoroscopy  to confirm the depth of penetration and orientation with respect to   abduction.  A screw was placed followed by the hole eliminator.  The final   36+4 neutral Altrex liner was impacted with good visualized rim fit.  The cup was positioned anatomically within the acetabular portion of the pelvis.      At this point, the femur was rolled at 80 degrees.  Further capsule was   released off the inferior aspect of the femoral neck.  I then   released the superior capsule proximally.  The hook was placed laterally   along the femur and elevated manually and held in position with the bed   hook.  The leg was then extended and adducted with the leg rolled to 100   degrees of external rotation.  Once the proximal femur was fully   exposed, I used a box osteotome to set orientation.  I then began   broaching with the starting chili pepper broach and passed this by hand and then broached up to the 9 broach as I was doing interval trial reductions.  With the 9 broach in place I chose a high offset neck and again did several trial reductions.  Pre-operative and intra-operative X-rays indicated significantly shorter left hip compared to the right.  With this stem and offset neck and the +5 I was able to get the lengths close but not all the way to normal as it appeared close to an inch short before.   Given these findings, I went ahead and dislocated the hip, repositioned all   retractors and positioned the right hip in the extended and abducted position.  The final 9 Hi Tri Lock stem was   chosen and it was impacted down to the level of neck cut.  Based on this   and the trial reduction, a 36+5 delta ceramic ball was chosen and   impacted onto a clean and dry trunnion, and the hip was reduced.  The   hip had been irrigated throughout the case again at this point.  I did   reapproximate the superior capsular leaflet to the anterior leaflet   using #1 Vicryl.  The fascia of  the  tensor fascia lata muscle was then reapproximated using #1 Vicryl and #0 V-lock sutures.  The   remaining wound was closed with 2-0 Vicryl and running 4-0 Monocryl.   The hip was cleaned, dried, and dressed sterilely using Dermabond and   Aquacel dressing.  He was then brought   to recovery room in stable condition tolerating the procedure well.    Skip Mayer, PA-C was present for the entirety of the case involved from   preoperative positioning, perioperative retractor management, general   facilitation of the case, as well as primary wound closure as assistant.            Madlyn Frankel Charlann Boxer, M.D.        07/12/2015 10:14 AM

## 2015-07-12 NOTE — Anesthesia Procedure Notes (Signed)
Spinal Patient location during procedure: OR End time: 07/12/2015 8:49 AM Staffing Resident/CRNA: Enrigue Catena E Performed by: resident/CRNA  Preanesthetic Checklist Completed: patient identified, site marked, surgical consent, pre-op evaluation, timeout performed, IV checked, risks and benefits discussed and monitors and equipment checked Spinal Block Patient position: sitting Prep: Betadine Patient monitoring: heart rate, continuous pulse ox and blood pressure Location: L3-4 Injection technique: single-shot Needle Needle type: Spinocan and Sprotte  Needle gauge: 25 G Needle length: 9 cm Assessment Sensory level: T4 Additional Notes Expiration date of kit checked and confirmed. Patient tolerated procedure well, without complications.

## 2015-07-12 NOTE — Discharge Instructions (Signed)

## 2015-07-12 NOTE — Anesthesia Preprocedure Evaluation (Signed)
Anesthesia Evaluation  Patient identified by MRN, date of birth, ID band Patient awake    Reviewed: Allergy & Precautions, NPO status , Patient's Chart, lab work & pertinent test results  Airway Mallampati: I  TM Distance: >3 FB Neck ROM: Full    Dental  (+) Teeth Intact, Dental Advisory Given   Pulmonary    breath sounds clear to auscultation       Cardiovascular  Rhythm:Regular Rate:Normal     Neuro/Psych    GI/Hepatic GERD  Medicated and Controlled,  Endo/Other    Renal/GU      Musculoskeletal  (+) Arthritis ,   Abdominal   Peds  Hematology   Anesthesia Other Findings   Reproductive/Obstetrics                             Anesthesia Physical Anesthesia Plan  ASA: II  Anesthesia Plan: Spinal   Post-op Pain Management:    Induction: Intravenous  Airway Management Planned: Simple Face Mask  Additional Equipment:   Intra-op Plan:   Post-operative Plan:   Informed Consent: I have reviewed the patients History and Physical, chart, labs and discussed the procedure including the risks, benefits and alternatives for the proposed anesthesia with the patient or authorized representative who has indicated his/her understanding and acceptance.   Dental advisory given  Plan Discussed with: CRNA, Anesthesiologist and Surgeon  Anesthesia Plan Comments:         Anesthesia Quick Evaluation

## 2015-07-12 NOTE — Transfer of Care (Signed)
Immediate Anesthesia Transfer of Care Note  Patient: Dale Wagner  Procedure(s) Performed: Procedure(s): LEFT TOTAL HIP ARTHROPLASTY ANTERIOR APPROACH (Left)  Patient Location: PACU  Anesthesia Type:Spinal  Level of Consciousness: sedated  Airway & Oxygen Therapy: Patient Spontanous Breathing and Patient connected to face mask oxygen  Post-op Assessment: Report given to RN and Post -op Vital signs reviewed and stable  Post vital signs: Reviewed and stable  Last Vitals:  Filed Vitals:   07/12/15 0608  BP: 130/76  Pulse: 63  Temp: 36.7 C  Resp: 16    Complications: No apparent anesthesia complications

## 2015-07-12 NOTE — Addendum Note (Signed)
Addendum  created 07/12/15 1207 by Illene Silver, CRNA   Modules edited: Anesthesia LDA, Lines/Drains/Airways Properties Editor   Lines/Drains/Airways Properties Editor:  Properties of line/drain/airway/wound Airway have been modified.

## 2015-07-12 NOTE — Evaluation (Signed)
Physical Therapy Evaluation Patient Details Name: Dale Wagner MRN: [id number] DOB: March 08, 1956 Today's Date: 07/12/2015   History of Present Illness  s/p L DA THA  Clinical Impression  Pt is s/p THA resulting in the deficits listed below (see PT Problem List). * Pt will benefit from skilled PT to increase their independence and safety with mobility to allow discharge to the venue listed below.      Follow Up Recommendations Home health PT    Equipment Recommendations  Rolling walker with 5" wheels;None recommended by PT    Recommendations for Other Services       Precautions / Restrictions Restrictions Weight Bearing Restrictions: No Other Position/Activity Restrictions: WBAT      Mobility  Bed Mobility Overal bed mobility: Needs Assistance Bed Mobility: Supine to Sit     Supine to sit: Supervision     General bed mobility comments: for safety and lines  Transfers Overall transfer level: Needs assistance Equipment used: Rolling walker (2 wheeled) Transfers: Sit to/from Stand Sit to Stand: Min assist         General transfer comment: cues for hand placement adn LLE position for incr comfort  Ambulation/Gait Ambulation/Gait assistance: Min assist Ambulation Distance (Feet): 100 Feet Assistive device: Rolling walker (2 wheeled) Gait Pattern/deviations: Step-to pattern;Step-through pattern;Decreased stride length Gait velocity: pt had 1 inch leg length discrepancy prior to surgery so now reports that "legs feel odd" being much closer to the saem length; discussed  muscle imbalance/progression after surgery   General Gait Details: cues for sequence initially, min to min/guard for safety and initial balance  Stairs            Wheelchair Mobility    Modified Rankin (Stroke Patients Only)       Balance                                             Pertinent Vitals/Pain Pain Assessment: 0-10 Pain Score: 3  Pain Location: L  hip Pain Descriptors / Indicators: Guarding;Sore Pain Intervention(s): Limited activity within patient's tolerance;Monitored during session;Repositioned;Premedicated before session;Ice applied    Home Living Family/patient expects to be discharged to:: Private residence Living Arrangements: Spouse/significant other Available Help at Discharge: Available 24 hours/day;Family Type of Home: House Home Access: Stairs to enter Entrance Stairs-Rails: Right;Left;Can reach both Secretary/administrator of Steps: 5 Home Layout: One level Home Equipment: None      Prior Function Level of Independence: Independent               Hand Dominance        Extremity/Trunk Assessment   Upper Extremity Assessment: Defer to OT evaluation           Lower Extremity Assessment: LLE deficits/detail   LLE Deficits / Details: ankle WFL,  knee and hip grossly 3/5     Communication   Communication: No difficulties  Cognition Arousal/Alertness: Awake/alert Behavior During Therapy: WFL for tasks assessed/performed Overall Cognitive Status: Within Functional Limits for tasks assessed                      General Comments      Exercises Total Joint Exercises Ankle Circles/Pumps: AROM;Both;10 reps Quad Sets: 10 reps;Both;AROM      Assessment/Plan    PT Assessment Patient needs continued PT services  PT Diagnosis     PT Problem List Decreased strength;Decreased  activity tolerance;Decreased balance;Decreased mobility;Decreased safety awareness;Decreased knowledge of use of DME  PT Treatment Interventions DME instruction;Gait training;Stair training;Functional mobility training;Therapeutic activities;Therapeutic exercise;Patient/family education   PT Goals (Current goals can be found in the Care Plan section) Acute Rehab PT Goals Patient Stated Goal: less pain PT Goal Formulation: With patient Time For Goal Achievement: 07/14/15 Potential to Achieve Goals: Good    Frequency  7X/week   Barriers to discharge        Co-evaluation               End of Session Equipment Utilized During Treatment: Gait belt Activity Tolerance: Patient tolerated treatment well Patient left: in chair;with call bell/phone within reach;with chair alarm set;with family/visitor present Nurse Communication: Mobility status         Time: 1610-9604 PT Time Calculation (min) (ACUTE ONLY): 17 min   Charges:   PT Evaluation $PT Eval Low Complexity: 1 Procedure     PT G Codes:        Dale Wagner 08/03/15, 5:04 PM

## 2015-07-12 NOTE — Anesthesia Postprocedure Evaluation (Signed)
Anesthesia Post Note  Patient: Dale Wagner  Procedure(s) Performed: Procedure(s) (LRB): LEFT TOTAL HIP ARTHROPLASTY ANTERIOR APPROACH (Left)  Patient location during evaluation: PACU Anesthesia Type: General Level of consciousness: awake and alert Pain management: pain level controlled Vital Signs Assessment: post-procedure vital signs reviewed and stable Respiratory status: spontaneous breathing, nonlabored ventilation and respiratory function stable Cardiovascular status: blood pressure returned to baseline and stable Postop Assessment: no signs of nausea or vomiting, patient able to bend at knees and spinal receding Anesthetic complications: no    Last Vitals:  Filed Vitals:   07/12/15 1130 07/12/15 1145  BP: 107/75 111/69  Pulse: 48   Temp:  36.6 C  Resp: 0     Last Pain:  Filed Vitals:   07/12/15 1147  PainSc: 0-No pain                 Namiah Dunnavant A

## 2015-07-12 NOTE — Progress Notes (Signed)
Pt given 0.5 mg dilaudid for surgical pain. Nurse remained with pt, who did not get relief. Second 0.5 mg given from same syringe without wasting. Pt reported med was effective. Pt's wife at bedside.

## 2015-07-12 NOTE — Interval H&P Note (Signed)
History and Physical Interval Note:  07/12/2015 7:13 AM  Dale Wagner  has presented today for surgery, with the diagnosis of LEFT HIP OA  The various methods of treatment have been discussed with the patient and family. After consideration of risks, benefits and other options for treatment, the patient has consented to  Procedure(s): LEFT TOTAL HIP ARTHROPLASTY ANTERIOR APPROACH (Left) as a surgical intervention .  The patient's history has been reviewed, patient examined, no change in status, stable for surgery.  I have reviewed the patient's chart and labs.  Questions were answered to the patient's satisfaction.     [name]

## 2015-07-12 NOTE — Addendum Note (Signed)
Addendum  created 07/12/15 1210 by Illene Silver, CRNA   Modules edited: Anesthesia Medication Administration

## 2015-07-13 DIAGNOSIS — E669 Obesity, unspecified: Secondary | ICD-10-CM | POA: Diagnosis present

## 2015-07-13 LAB — BASIC METABOLIC PANEL
ANION GAP: 11 (ref 5–15)
BUN: 12 mg/dL (ref 6–20)
CALCIUM: 9 mg/dL (ref 8.9–10.3)
CO2: 22 mmol/L (ref 22–32)
Chloride: 107 mmol/L (ref 101–111)
Creatinine, Ser: 0.66 mg/dL (ref 0.61–1.24)
GFR calc Af Amer: >60 mL/min (ref >60)
Glucose, Bld: 151 mg/dL — ABNORMAL HIGH (ref 65–99)
POTASSIUM: 4.7 mmol/L (ref 3.5–5.1)
SODIUM: 140 mmol/L (ref 135–145)

## 2015-07-13 LAB — CBC
HCT: 38.4 % — ABNORMAL LOW (ref 39.0–52.0)
Hemoglobin: 12.8 g/dL — ABNORMAL LOW (ref 13.0–17.0)
MCH: 30.7 pg (ref 26.0–34.0)
MCHC: 33.3 g/dL (ref 30.0–36.0)
MCV: 92.1 fL (ref 78.0–100.0)
PLATELETS: 163 10*3/uL (ref 150–400)
RBC: 4.17 MIL/uL — AB (ref 4.22–5.81)
RDW: 12.9 % (ref 11.5–15.5)
WBC: 9.7 10*3/uL (ref 4.0–10.5)

## 2015-07-13 MED ORDER — ASPIRIN 325 MG PO TBEC: 325.0000 mg | DELAYED_RELEASE_TABLET | Freq: Two times a day (BID) | ORAL | Status: AC

## 2015-07-13 MED ORDER — DOCUSATE SODIUM 100 MG PO CAPS: 100.0000 mg | ORAL_CAPSULE | Freq: Two times a day (BID) | ORAL | Status: DC

## 2015-07-13 MED ORDER — METHOCARBAMOL 500 MG PO TABS: 500.0000 mg | ORAL_TABLET | Freq: Four times a day (QID) | ORAL | Status: DC | PRN

## 2015-07-13 MED ORDER — POLYETHYLENE GLYCOL 3350 17 G PO PACK: 17.0000 g | PACK | Freq: Two times a day (BID) | ORAL | Status: DC

## 2015-07-13 MED ORDER — HYDROCODONE-ACETAMINOPHEN 7.5-325 MG PO TABS: 1.0000 | ORAL_TABLET | ORAL | Status: DC | PRN

## 2015-07-13 MED ORDER — FERROUS SULFATE 325 (65 FE) MG PO TABS: 325.0000 mg | ORAL_TABLET | Freq: Three times a day (TID) | ORAL | Status: DC

## 2015-07-13 NOTE — Care Management Note (Signed)
Case Management Note  Patient Details  Name: Dale Wagner MRN: [id number] Date of Birth: 09-07-1955  Subjective/Objective:                  LEFT TOTAL HIP ARTHROPLASTY ANTERIOR APPROACH (Left)  Action/Plan: Discharge planning Expected Discharge Date:  07/13/15               Expected Discharge Plan:  [place]  In-House Referral:     Discharge planning Services  CM Consult  Post Acute Care Choice:  Home Health Choice offered to:  Patient, Spouse  DME Arranged:  3-N-1, Walker rolling DME Agency:  [place]:  PT [place]:  [place]  Status of Service:  Completed, signed off  Medicare Important Message Given:    Date Medicare IM Given:    Medicare IM give by:    Date Additional Medicare IM Given:    Additional Medicare Important Message give by:     If discussed at [place] of Stay Meetings, dates discussed:    Additional Comments: CM met with pt in room to offer choice of home health agency.  Pt chooses [facility] to render HHPT.  Referral called to [employer], Tim.  CM called [facility] DME rep, Merry Proud to please deliver the 3n1 and rolling walker to room piror to discharge.  No other CM needs were communicated. Dellie Catholic, RN 07/13/2015, 11:09 AM

## 2015-07-13 NOTE — Progress Notes (Signed)
RN reviewed discharge instructions with patient and family. All questions answered.   Paperwork and prescriptions given.   NT rolled patient down with all belongings to family car. 

## 2015-07-13 NOTE — Evaluation (Signed)
Occupational Therapy Evaluation Patient Details Name: Dale Wagner MRN: [id number] DOB: 1955-10-12 Today's Date: 07/13/2015    History of Present Illness s/p L DA THA   Clinical Impression   This 60 year old man was admitted for the above surgery. All education was completed.  No further OT is needed at this time    Follow Up Recommendations  No OT follow up    Equipment Recommendations  3 in 1 bedside comode    Recommendations for Other Services       Precautions / Restrictions Precautions Precautions: Fall Restrictions Weight Bearing Restrictions: No      Mobility Bed Mobility         Supine to sit: Supervision     General bed mobility comments: for safety and lines  Transfers   Equipment used: Rolling walker (2 wheeled) Transfers: Sit to/from Stand Sit to Stand: Supervision         General transfer comment: cues for UE placement    Balance                                            ADL Overall ADL's : Needs assistance/impaired     Grooming: Supervision/safety;Oral care;Standing   Upper Body Bathing: Set up;Sitting   Lower Body Bathing: Minimal assistance;Sit to/from stand   Upper Body Dressing : Supervision/safety;Sitting   Lower Body Dressing: Moderate assistance;Sit to/from stand   Toilet Transfer: Min guard;Ambulation;BSC;RW   Toileting- Clothing Manipulation and Hygiene: Supervision/safety;Sit to/from stand   Tub/ Shower Transfer: Walk-in shower;Min guard;Ambulation;3 in 1     General ADL Comments: reviewed AE; wife will assist him.  Explained positioning of 3:1 in shower and recommended wife wipe legs after use to avoid pins rusting     Vision     Perception     Praxis      Pertinent Vitals/Pain Pain Assessment: 0-10 Pain Score: 2  Pain Location: L hip Pain Descriptors / Indicators: Sore Pain Intervention(s): Limited activity within patient's tolerance;Monitored during session;Premedicated before  session;Repositioned;Ice applied     Hand Dominance     Extremity/Trunk Assessment Upper Extremity Assessment Upper Extremity Assessment: Overall WFL for tasks assessed           Communication Communication Communication: No difficulties   Cognition Arousal/Alertness: Awake/alert Behavior During Therapy: WFL for tasks assessed/performed Overall Cognitive Status: Within Functional Limits for tasks assessed                     General Comments       Exercises       Shoulder Instructions      Home Living Family/patient expects to be discharged to:: Private residence Living Arrangements: Spouse/significant other                 Bathroom Shower/Tub: Producer, television/film/video: Standard     Home Equipment: None          Prior Functioning/Environment Level of Independence: Needs assistance        Comments: for socks    OT Diagnosis: Acute pain   OT Problem List:     OT Treatment/Interventions:      OT Goals(Current goals can be found in the care plan section) Acute Rehab OT Goals Patient Stated Goal: less pain OT Goal Formulation: All assessment and education complete, DC therapy  OT Frequency:  Barriers to D/C:            Co-evaluation              End of Session    Activity Tolerance: Patient tolerated treatment well Patient left: in chair;with call bell/phone within reach;with family/visitor present   Time: 4540-9811 OT Time Calculation (min): 19 min Charges:  OT General Charges $OT Visit: 1 Procedure OT Evaluation $OT Eval Low Complexity: 1 Procedure G-Codes:    Rona Tomson 08/09/15, 10:10 AM  Marica Otter, OTR/L [phone] Aug 09, 2015

## 2015-07-13 NOTE — Progress Notes (Signed)
     Subjective: 1 Day Post-Op Procedure(s) (LRB): LEFT TOTAL HIP ARTHROPLASTY ANTERIOR APPROACH (Left)   Patient reports pain as mild, pain controlled.  No events throughout the night.  He was able to stand up yesterday and he was impressed with how his leg length are much better than prior to surgery.  Ready to be discharged home.  Objective:   VITALS:   Filed Vitals:   07/13/15 0458 07/13/15 0907  BP: 119/65 113/71  Pulse: 54 56  Temp: 97.5 F (36.4 C) 97.7 F (36.5 C)  Resp: 15 16    Dorsiflexion/Plantar flexion intact Incision: dressing C/D/I No cellulitis present Compartment soft  LABS  Recent Labs  07/13/15 0444  HGB 12.8*  HCT 38.4*  WBC 9.7  PLT 163     Recent Labs  07/13/15 0444  NA 140  K 4.7  BUN 12  CREATININE 0.66  GLUCOSE 151*     Assessment/Plan: 1 Day Post-Op Procedure(s) (LRB): LEFT TOTAL HIP ARTHROPLASTY ANTERIOR APPROACH (Left) Foley cath d/c'ed Advance diet Up with therapy D/C IV fluids Discharge home with home health  Follow up in 2 weeks at [facility]. Follow up with OLIN,Nery Frappier D in 2 weeks.  Contact information:  [facility] [address] [place] [address] [phone]    Obese (BMI 30-39.9) Estimated body mass index is 34.6 kg/(m^2) as calculated from the following:   Height as of this encounter:  (1.803 m).   Weight as of this encounter: 112.492 kg (248 lb). Patient also counseled that weight may inhibit the healing process Patient counseled that losing weight will help with future health issues         Dale Wagner. Dale Wagner   PAC  07/13/2015, 9:10 AM

## 2015-07-13 NOTE — Progress Notes (Signed)
Physical Therapy Treatment Patient Details Name: Dale Wagner MRN: [id number] DOB: 08/11/1955 Today's Date: 07/13/2015    History of Present Illness s/p L DA THA    PT Comments    POD # 1  Pt progressing well.  Assisted with amb a greater distance in hallway, practiced stairs then performed all supine THR TE's following handout.  All mobility questions addressed.  Pt ready for D/C to home.   Follow Up Recommendations  Home health PT     Equipment Recommendations  Rolling walker with 5" wheels    Recommendations for Other Services       Precautions / Restrictions Precautions Precautions: Fall Restrictions Weight Bearing Restrictions: No Other Position/Activity Restrictions: WBAT    Mobility  Bed Mobility               General bed mobility comments: Pt OOB in recliner  Transfers Overall transfer level: Needs assistance Equipment used: Rolling walker (2 wheeled) Transfers: Sit to/from Stand Sit to Stand: Supervision         General transfer comment: good safety cognition  Ambulation/Gait Ambulation/Gait assistance: Supervision Ambulation Distance (Feet): 250 Feet Assistive device: Rolling walker (2 wheeled) Gait Pattern/deviations: Step-through pattern     General Gait Details: one VC for safety with backward gait   Stairs Stairs: Yes Stairs assistance: Min guard Stair Management: Two rails;Forwards;Step to pattern Number of Stairs: 4 General stair comments: with spouse  Wheelchair Mobility    Modified Rankin (Stroke Patients Only)       Balance                                    Cognition Arousal/Alertness: Awake/alert Behavior During Therapy: WFL for tasks assessed/performed Overall Cognitive Status: Within Functional Limits for tasks assessed                      Exercises      General Comments        Pertinent Vitals/Pain Pain Assessment: 0-10 Pain Score: 2  Pain Location: L hip Pain  Descriptors / Indicators: Tightness;Sore Pain Intervention(s): Monitored during session;Premedicated before session;Repositioned;Ice applied    Home Living                      Prior Function            PT Goals (current goals can now be found in the care plan section) Progress towards PT goals: Progressing toward goals    Frequency  7X/week    PT Plan      Co-evaluation             End of Session Equipment Utilized During Treatment: Gait belt Activity Tolerance: Patient tolerated treatment well Patient left: in chair;with call bell/phone within reach;with chair alarm set;with family/visitor present     Time: 1040-1110 PT Time Calculation (min) (ACUTE ONLY): 30 min  Charges:  $Gait Training: 8-22 mins $Therapeutic Exercise: 8-22 mins                    G Codes:      Felecia Shelling  PTA [facility]  Acute  Rehab Pager      [phone]

## 2015-07-21 NOTE — Discharge Summary (Signed)
Physician Discharge Summary  Patient ID: Dale Wagner MRN: [id number] DOB/AGE: August 26, 1955 60 y.o.  Admit date: 07/12/2015 Discharge date: 07/13/2015   Procedures:  Procedure(s) (LRB): LEFT TOTAL HIP ARTHROPLASTY ANTERIOR APPROACH (Left)  Attending Physician:  Dr. Durene Romans   Admission Diagnoses:   Left hip primary OA / pain  Discharge Diagnoses:  Principal Problem:   S/P left THA, AA Active Problems:   Obese  Past Medical History  Diagnosis Date  . Arthritis   . GERD (gastroesophageal reflux disease)     HPI:    Dale Wagner, 60 y.o. male, has a history of pain and functional disability in the left hip(s) due to arthritis and patient has failed non-surgical conservative treatments for greater than 12 weeks to include NSAID's and/or analgesics and activity modification. Onset of symptoms was gradual starting ~2 years ago with gradually worsening course since that time.The patient noted no past surgery on the left hip(s). Patient currently rates pain in the left hip at 10 out of 10 with activity. Patient has night pain, worsening of pain with activity and weight bearing, trendelenberg gait, pain that interfers with activities of daily living and pain with passive range of motion. Patient has evidence of periarticular osteophytes and joint space narrowing by imaging studies. This condition presents safety issues increasing the risk of falls. There is no current active infection. Risks, benefits and expectations were discussed with the patient. Risks including but not limited to the risk of anesthesia, blood clots, nerve damage, blood vessel damage, failure of the prosthesis, infection and up to and including death. Patient understand the risks, benefits and expectations and wishes to proceed with surgery.   PCP: Lolita Patella, MD   Discharged Condition: good  Hospital Course:  Patient underwent the above stated procedure on 07/12/2015. Patient tolerated the  procedure well and brought to the recovery room in good condition and subsequently to the floor.  POD #1 BP: 113/71 ; Pulse: 56 ; Temp: 97.7 F (36.5 C) ; Resp: 16 Patient reports pain as mild, pain controlled. No events throughout the night. He was able to stand up yesterday and he was impressed with how his leg length are much better than prior to surgery. Ready to be discharged home. Dorsiflexion/plantar flexion intact, incision: dressing C/D/I, no cellulitis present and compartment soft.   LABS  Basename    HGB     12.8  HCT     38.4    Discharge Exam: General appearance: alert, cooperative and no distress Extremities: Homans sign is negative, no sign of DVT, no edema, redness or tenderness in the calves or thighs and no ulcers, gangrene or trophic changes  Disposition: Home with follow up in 2 weeks   Follow-up Information    Follow up with [name], MD. Schedule an appointment as soon as possible for a visit in 2 weeks.   Specialty:  Orthopedic Surgery   Contact information:   [address] [place] [address] [phone]       Follow up with [facility].   Why:  home health physical therapy   Contact information:   [address] [place] [address] [phone]       Follow up with [facility].   Why:  rolling walker and 3n1   Contact information:   [address] [place] [address] [phone]       Discharge Instructions    Call MD / Call  911    Complete by:  As directed   If you experience chest pain or shortness of breath, CALL 911 and be transported to the hospital emergency room.  If you develope a fever above 101 F, pus (white drainage) or increased drainage or redness at the wound, or calf pain, call your surgeon's office.     Change dressing    Complete by:  As directed   Maintain surgical dressing until follow up in the clinic. If the edges start to pull up, may reinforce  with tape. If the dressing is no longer working, may remove and cover with gauze and tape, but must keep the area dry and clean.  Call with any questions or concerns.     Constipation Prevention    Complete by:  As directed   Drink plenty of fluids.  Prune juice may be helpful.  You may use a stool softener, such as Colace (over the counter) 100 mg twice a day.  Use MiraLax (over the counter) for constipation as needed.     Diet - low sodium heart healthy    Complete by:  As directed      Discharge instructions    Complete by:  As directed   Maintain surgical dressing until follow up in the clinic. If the edges start to pull up, may reinforce with tape. If the dressing is no longer working, may remove and cover with gauze and tape, but must keep the area dry and clean.  Follow up in 2 weeks at [facility]. Call with any questions or concerns.     Increase activity slowly as tolerated    Complete by:  As directed   Weight bearing as tolerated with assist device (walker, cane, etc) as directed, use it as long as suggested by your surgeon or therapist, typically at least 4-6 weeks.     TED hose    Complete by:  As directed   Use stockings (TED hose) for 2 weeks on both leg(s).  You may remove them at night for sleeping.             Medication List    STOP taking these medications        acetaminophen 500 MG tablet  Commonly known as:  TYLENOL     naproxen sodium 220 MG tablet  Commonly known as:  ANAPROX      TAKE these medications        aspirin 325 MG EC tablet  Take 1 tablet (325 mg total) by mouth 2 (two) times daily.     docusate sodium 100 MG capsule  Commonly known as:  COLACE  Take 1 capsule (100 mg total) by mouth 2 (two) times daily.     ferrous sulfate 325 (65 FE) MG tablet  Take 1 tablet (325 mg total) by mouth 3 (three) times daily after meals.     HYDROcodone-acetaminophen 7.5-325 MG tablet  Commonly known as:  NORCO  Take 1-2 tablets by mouth  every 4 (four) hours as needed for moderate pain.     methocarbamol 500 MG tablet  Commonly known as:  ROBAXIN  Take 1 tablet (500 mg total) by mouth every 6 (six) hours as needed for muscle spasms.     polyethylene glycol packet  Commonly known as:  MIRALAX / GLYCOLAX  Take 17 g by mouth 2 (two) times daily.         Signed: Anastasio Wagner. Dale Florence   PA-C  07/21/2015, 10:53 AM

## 2016-05-20 ENCOUNTER — Emergency Department (HOSPITAL_COMMUNITY)
Admission: EM | Admit: 2016-05-20 | Discharge: 2016-05-20 | Discharge status: Absent without leave | Payer: Commercial Managed Care - PPO

## 2017-02-15 IMAGING — DX DG HIP (WITH OR WITHOUT PELVIS) 1V PORT*L*
2 series · 2 of 2 positions shown · non-contrast
Comparison: None.

CLINICAL DATA: Postop left total hip, anterior approach.

EXAM:
DG C-ARM 1-60 MIN-NO REPORT; DG HIP (WITH OR WITHOUT PELVIS) 1V PORT
LEFT

[pelvis ap]
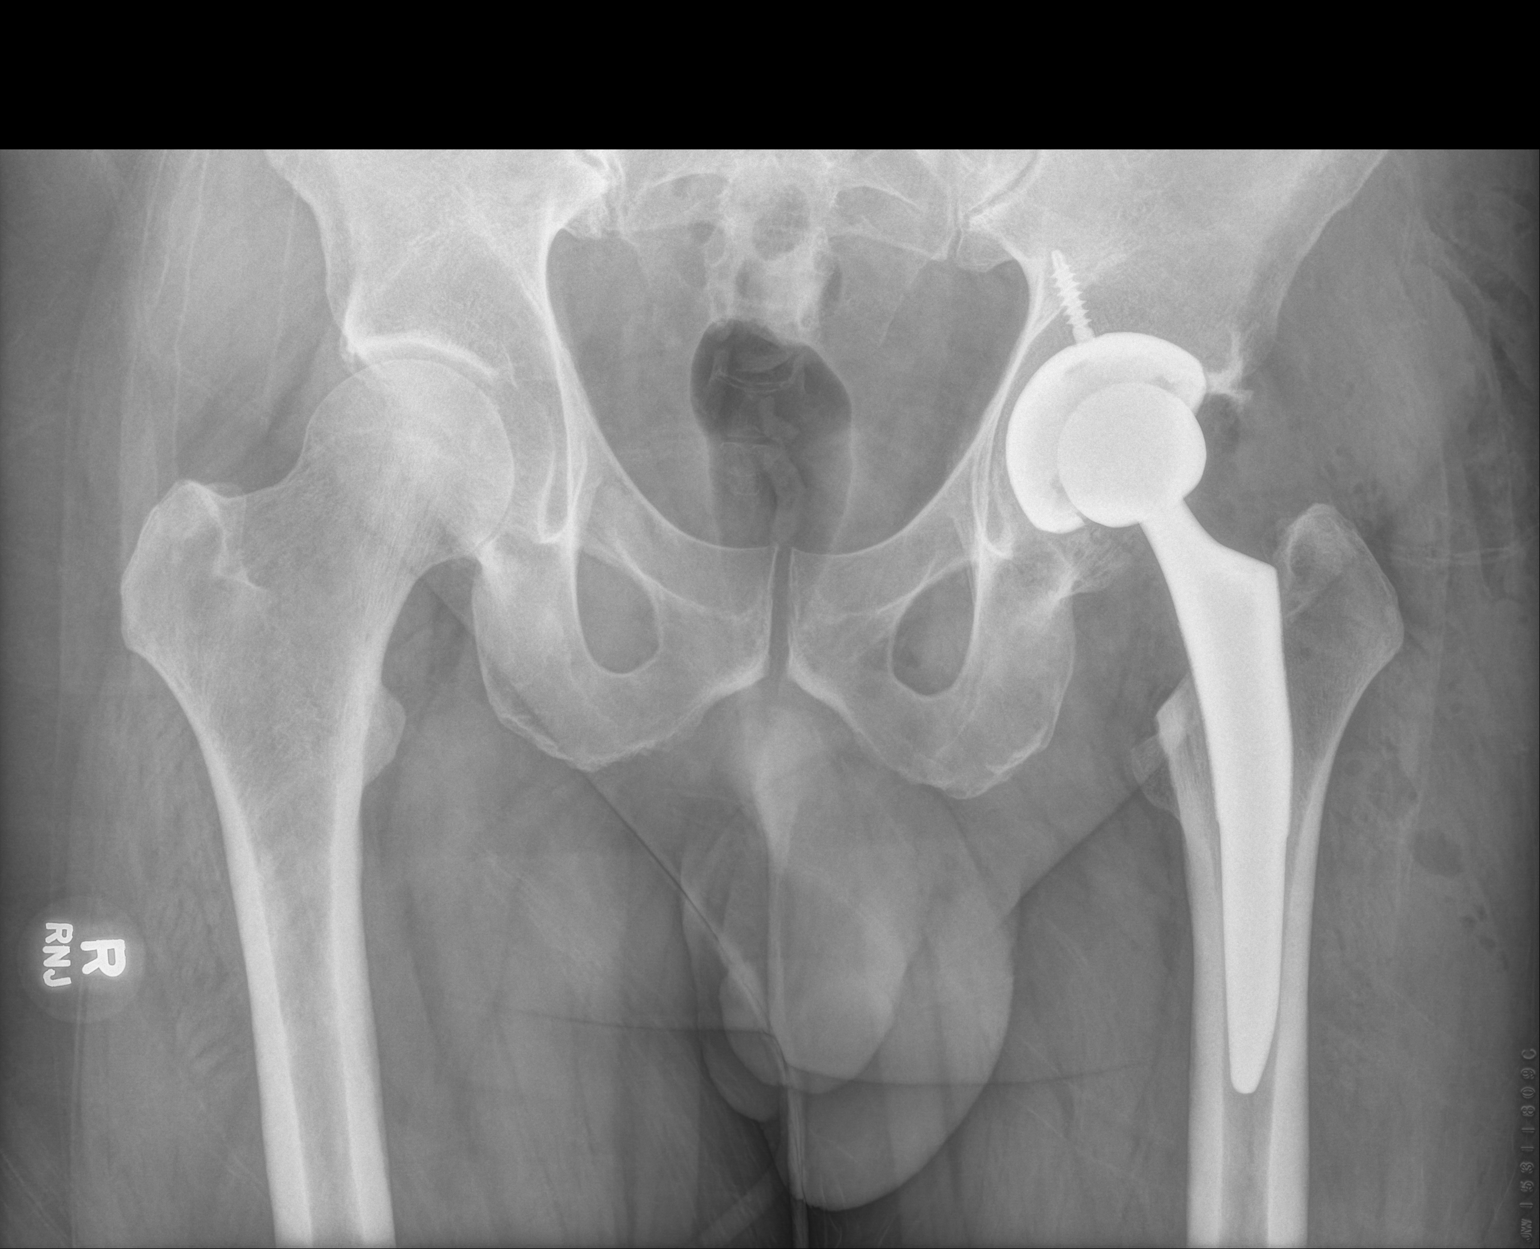

[hip x-table]
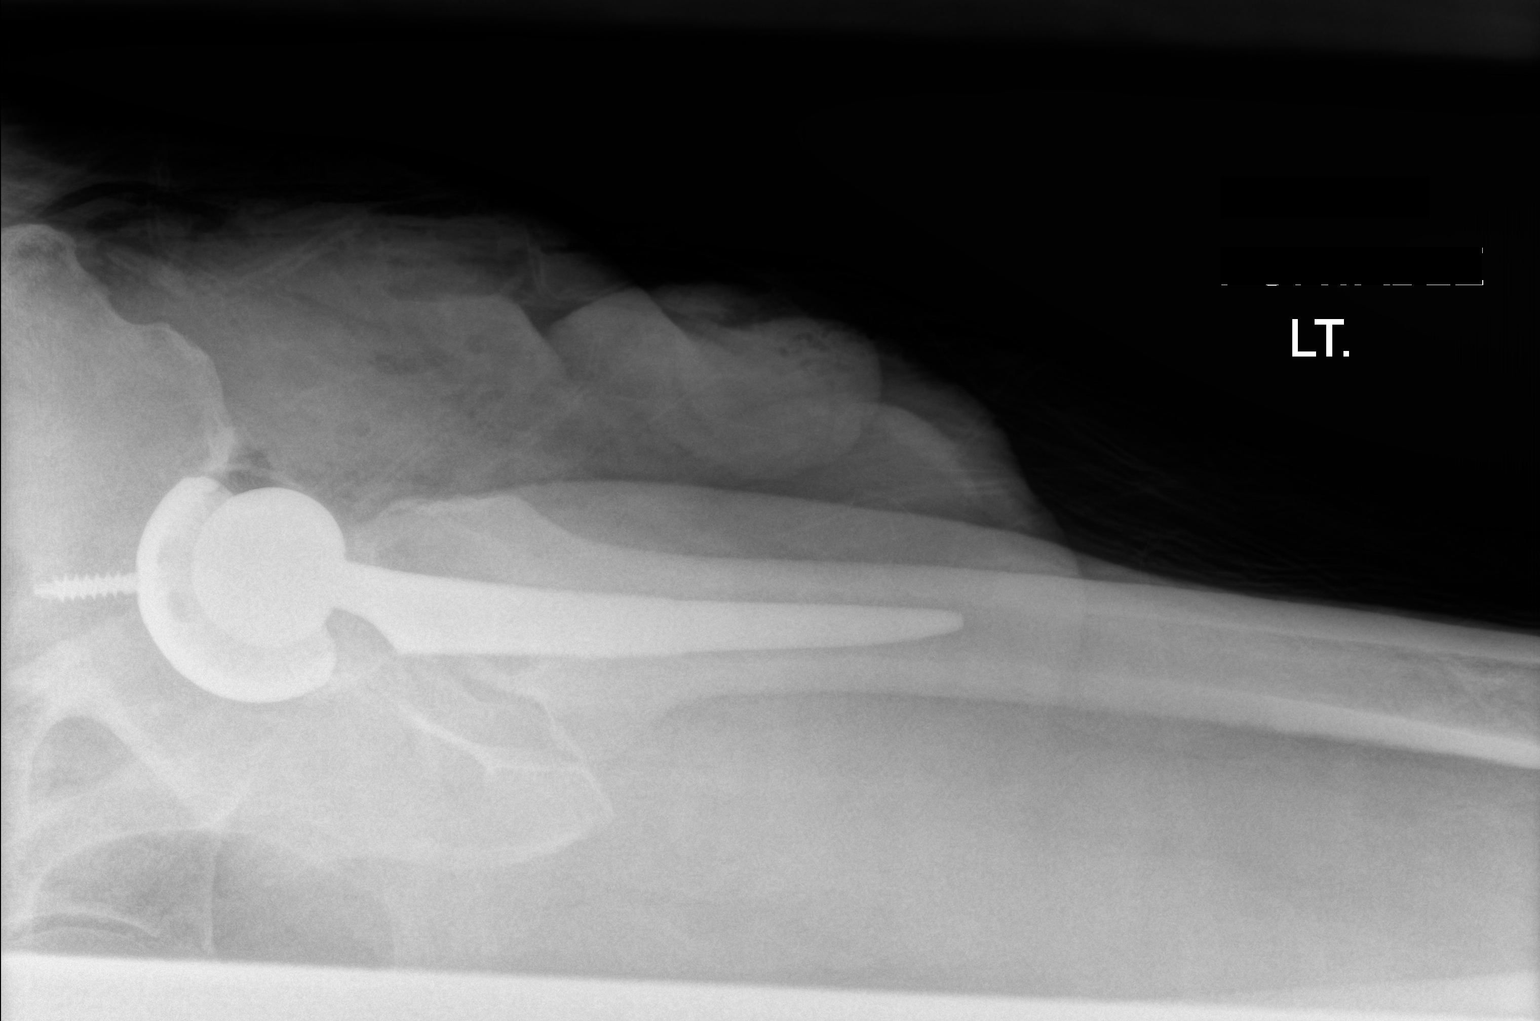

[2 of 2 positions shown; findings below may reference images not displayed]

FINDINGS: AP and cross-table lateral views are provided. Patient is status
post total left hip arthroplasty. Hardware appears intact and well
positioned. No surgical complicating feature seen. Expected
postsurgical changes within the surrounding soft tissues.
IMPRESSION: Status post total left hip arthroplasty. No surgical complicating
features seen.

## 2017-11-03 DIAGNOSIS — K4090 Unilateral inguinal hernia, without obstruction or gangrene, not specified as recurrent: Secondary | ICD-10-CM | POA: Diagnosis not present

## 2017-11-14 DIAGNOSIS — K4090 Unilateral inguinal hernia, without obstruction or gangrene, not specified as recurrent: Secondary | ICD-10-CM | POA: Diagnosis not present

## 2017-11-19 ENCOUNTER — Ambulatory Visit: Payer: Self-pay | Admitting: General Surgery

## 2017-11-28 DIAGNOSIS — Z0000 Encounter for general adult medical examination without abnormal findings: Secondary | ICD-10-CM | POA: Diagnosis not present

## 2017-11-28 DIAGNOSIS — Z125 Encounter for screening for malignant neoplasm of prostate: Secondary | ICD-10-CM | POA: Diagnosis not present

## 2017-11-28 DIAGNOSIS — E782 Mixed hyperlipidemia: Secondary | ICD-10-CM | POA: Diagnosis not present

## 2017-11-28 DIAGNOSIS — Z23 Encounter for immunization: Secondary | ICD-10-CM | POA: Diagnosis not present

## 2017-12-17 ENCOUNTER — Other Ambulatory Visit: Payer: Self-pay

## 2017-12-17 ENCOUNTER — Encounter (HOSPITAL_BASED_OUTPATIENT_CLINIC_OR_DEPARTMENT_OTHER): Payer: Self-pay | Admitting: *Deleted

## 2017-12-17 DIAGNOSIS — K4090 Unilateral inguinal hernia, without obstruction or gangrene, not specified as recurrent: Secondary | ICD-10-CM | POA: Diagnosis present

## 2017-12-17 DIAGNOSIS — Z79899 Other long term (current) drug therapy: Secondary | ICD-10-CM | POA: Diagnosis not present

## 2017-12-17 DIAGNOSIS — M1990 Unspecified osteoarthritis, unspecified site: Secondary | ICD-10-CM | POA: Diagnosis not present

## 2017-12-17 DIAGNOSIS — Z96642 Presence of left artificial hip joint: Secondary | ICD-10-CM | POA: Diagnosis not present

## 2017-12-17 DIAGNOSIS — K219 Gastro-esophageal reflux disease without esophagitis: Secondary | ICD-10-CM | POA: Diagnosis not present

## 2017-12-17 NOTE — Progress Notes (Addendum)
Your procedure is scheduled on  ____07-18-2019________  Report to [facility] AT  5:30A. M.__   Call this number if you have problems the morning of surgery  :[phone].   OUR ADDRESS IS [address].  WE ARE LOCATED IN [place]                                   [facility].                                     REMEMBER:  NO SOLID FOOD AFTER MIDNIGHT THE NIGHT PRIOR TO SURGERY. NOTHING BY MOUTH EXCEPT CLEAR LIQUID DIET UNTIL 3 HOURS PRIOR TO SCHEULED SURGERY. PLEASE FINISH ENSURE DRINK PER SURGEON ORDER 3 HOURS PRIOR TO SCHEDULED SURGERY TIME WHICH NEEDS TO BE COMPLETED AT ____4:30am_____.    CLEAR LIQUID DIET   Foods Allowed                                                                     Foods Excluded  Coffee and tea, regular and decaf                             liquids that you cannot  Plain Jell-O in any flavor                                             see through such as: Fruit ices (not with fruit pulp)                                     milk, soups, orange juice  Iced Popsicles                                    All solid food Carbonated beverages, regular and diet                                    Cranberry, grape and apple juices Sports drinks like Gatorade Lightly seasoned clear broth or consume(fat free) Sugar, honey syrup  Sample Menu Breakfast                                Lunch                                     Supper Cranberry juice                    Beef broth  Chicken broth Jell-O                                     Grape juice                           Apple juice Coffee or tea                        Jell-O                                      Popsicle                                                Coffee or tea                        Coffee or tea  _____________________________________________________________________    TAKE THESE MEDICATIONS MORNING OF SURGERY WITH A SIP OF WATER:   __NEXIUM_____                                    DO NOT WEAR JEWERLY DO NOT WEAR LOTIONS, POWDERS, PERFUMES OR DEODORANT.   MEN MAY SHAVE FACE AND NECK.  CONTACTS, GLASSES, OR DENTURES MAY NOT BE WORN TO SURGERY.                                    [facility] IS NOT RESPONSIBLE  FOR ANY BELONGINGS.                                                                    Marland Kitchen   [place] - Preparing for Surgery Before surgery, you can play an important role.  Because skin is not sterile, your skin needs to be as free of germs as possible.  You can reduce the number of germs on your skin by washing with CHG (chlorahexidine gluconate) soap before surgery.  CHG is an antiseptic cleaner which kills germs and bonds with the skin to continue killing germs even after washing. Please DO NOT use if you have an allergy to CHG or antibacterial soaps.  If your skin becomes reddened/irritated stop using the CHG and inform your nurse when you arrive at Short Stay. Do not shave (including legs and underarms) for at least 48 hours prior to the first CHG shower.  You may shave your face/neck. Please follow these instructions carefully:  1.  Shower with CHG Soap the night before surgery and the  morning of Surgery.  2.  If you choose to wash your hair, wash your hair first as usual with your  normal  shampoo.  3.  After you shampoo, rinse your hair and body thoroughly to remove the  shamp4.  Use CHG as you would any other liquid soap.  You can apply chg directly  to the skin and wash                       Gently with a scrungie or clean washcloth.  5.  Apply the CHG Soap to your body ONLY FROM THE NECK DOWN.   Do not use on face/ open                           Wound or open sores. Avoid contact with eyes, ears mouth and genitals (private parts).                       Wash face,  Genitals (private parts) with your normal soap.             6.  Wash thoroughly, paying special attention to the area where your surgery  will  be performed.  7.  Thoroughly rinse your body with warm water from the neck down.  8.  DO NOT shower/wash with your normal soap after using and rinsing off  the CHG Soap.              9.  Pat yourself dry with a clean towel.            10.  Wear clean pajamas.            11.  Place clean sheets on your bed the night of your first shower and do not  sleep with pets. Day of Surgery : Do not apply any lotions/deodorants the morning of surgery.  Please wear clean clothes to the surgery center.

## 2017-12-17 NOTE — Progress Notes (Addendum)
Spoke w/ pt via phone for pre-op interview.  Npo after mn w/ exception clear liquid diet until 0430 at which time pt to finish ensure pre-surgery drink.  Arrive at 0530.   Will take nexium am dos w/ sips of water. Will do hibiclens shower hs and before am dos.  Instructions printed , placed in bag w/ drink and hibiclens, [facility].

## 2017-12-18 NOTE — Anesthesia Preprocedure Evaluation (Signed)
Anesthesia Evaluation  Patient identified by MRN, date of birth, ID band Patient awake    Reviewed: Allergy & Precautions, NPO status , Patient's Chart, lab work & pertinent test results  Airway Mallampati: I  TM Distance: >3 FB Neck ROM: Full    Dental  (+) Teeth Intact, Dental Advisory Given   Pulmonary    breath sounds clear to auscultation       Cardiovascular  Rhythm:Regular Rate:Normal     Neuro/Psych    GI/Hepatic GERD  Medicated and Controlled,  Endo/Other    Renal/GU      Musculoskeletal  (+) Arthritis ,   Abdominal   Peds  Hematology   Anesthesia Other Findings   Reproductive/Obstetrics                             Anesthesia Physical  Anesthesia Plan  ASA: II  Anesthesia Plan: General   Post-op Pain Management: GA combined w/ Regional for post-op pain   Induction: Intravenous  PONV Risk Score and Plan: 2 and Ondansetron, Treatment may vary due to age or medical condition and Dexamethasone  Airway Management Planned: Simple Face Mask  Additional Equipment:   Intra-op Plan:   Post-operative Plan: Extubation in OR  Informed Consent: I have reviewed the patients History and Physical, chart, labs and discussed the procedure including the risks, benefits and alternatives for the proposed anesthesia with the patient or authorized representative who has indicated his/her understanding and acceptance.   Dental advisory given  Plan Discussed with: CRNA, Anesthesiologist and Surgeon  Anesthesia Plan Comments: (  )        Anesthesia Quick Evaluation

## 2017-12-19 ENCOUNTER — Ambulatory Visit (HOSPITAL_BASED_OUTPATIENT_CLINIC_OR_DEPARTMENT_OTHER)
Admission: RE | Admit: 2017-12-19 | Discharge: 2017-12-19 | Disposition: A | Discharge status: Home / Self Care | Payer: Commercial Managed Care - PPO | Source: Ambulatory Visit | Attending: General Surgery | Admitting: General Surgery

## 2017-12-19 ENCOUNTER — Encounter (HOSPITAL_BASED_OUTPATIENT_CLINIC_OR_DEPARTMENT_OTHER)
Admission: RE | Disposition: A | Discharge status: Home / Self Care | Payer: Self-pay | Source: Ambulatory Visit | Attending: General Surgery

## 2017-12-19 ENCOUNTER — Encounter (HOSPITAL_BASED_OUTPATIENT_CLINIC_OR_DEPARTMENT_OTHER): Payer: Self-pay

## 2017-12-19 ENCOUNTER — Ambulatory Visit (HOSPITAL_BASED_OUTPATIENT_CLINIC_OR_DEPARTMENT_OTHER): Payer: Commercial Managed Care - PPO | Admitting: Anesthesiology

## 2017-12-19 DIAGNOSIS — G8918 Other acute postprocedural pain: Secondary | ICD-10-CM | POA: Diagnosis not present

## 2017-12-19 DIAGNOSIS — M1990 Unspecified osteoarthritis, unspecified site: Secondary | ICD-10-CM | POA: Diagnosis not present

## 2017-12-19 DIAGNOSIS — Z79899 Other long term (current) drug therapy: Secondary | ICD-10-CM | POA: Insufficient documentation

## 2017-12-19 DIAGNOSIS — Z96642 Presence of left artificial hip joint: Secondary | ICD-10-CM | POA: Insufficient documentation

## 2017-12-19 DIAGNOSIS — K219 Gastro-esophageal reflux disease without esophagitis: Secondary | ICD-10-CM | POA: Diagnosis not present

## 2017-12-19 DIAGNOSIS — K4090 Unilateral inguinal hernia, without obstruction or gangrene, not specified as recurrent: Secondary | ICD-10-CM | POA: Diagnosis not present

## 2017-12-19 HISTORY — PX: INGUINAL HERNIA REPAIR: SHX194

## 2017-12-19 HISTORY — DX: Presence of spectacles and contact lenses: Z97.3

## 2017-12-19 HISTORY — PX: INSERTION OF MESH: SHX5868

## 2017-12-19 HISTORY — DX: Unilateral inguinal hernia, without obstruction or gangrene, not specified as recurrent: K40.90

## 2017-12-19 SURGERY — REPAIR, HERNIA, INGUINAL, ADULT
Anesthesia: General

## 2017-12-19 MED ORDER — ACETAMINOPHEN 325 MG PO TABS
325.0000 mg | ORAL_TABLET | ORAL | Status: DC | PRN
  Filled 2017-12-19: qty 2

## 2017-12-19 MED ORDER — LIDOCAINE 2% (20 MG/ML) 5 ML SYRINGE
INTRAMUSCULAR | Status: AC
  Filled 2017-12-19: qty 5

## 2017-12-19 MED ORDER — CEFAZOLIN SODIUM-DEXTROSE 2-4 GM/100ML-% IV SOLN
2.0000 g | INTRAVENOUS | Status: AC
  Administered 2017-12-19: 2 g via INTRAVENOUS
  Filled 2017-12-19: qty 100

## 2017-12-19 MED ORDER — GABAPENTIN 300 MG PO CAPS
300.0000 mg | ORAL_CAPSULE | ORAL | Status: AC
  Administered 2017-12-19: 300 mg via ORAL
  Filled 2017-12-19: qty 1

## 2017-12-19 MED ORDER — SUGAMMADEX SODIUM 200 MG/2ML IV SOLN
INTRAVENOUS | Status: DC | PRN
  Administered 2017-12-19: 150 mg via INTRAVENOUS

## 2017-12-19 MED ORDER — HYDROCODONE-ACETAMINOPHEN 5-325 MG PO TABS: 1.0000 | ORAL_TABLET | Freq: Four times a day (QID) | ORAL | 0 refills | Status: DC | PRN

## 2017-12-19 MED ORDER — ACETAMINOPHEN 500 MG PO TABS
1000.0000 mg | ORAL_TABLET | ORAL | Status: AC
  Administered 2017-12-19: 1000 mg via ORAL
  Filled 2017-12-19: qty 2

## 2017-12-19 MED ORDER — PROPOFOL 10 MG/ML IV BOLUS
INTRAVENOUS | Status: AC
  Filled 2017-12-19: qty 40

## 2017-12-19 MED ORDER — MEPERIDINE HCL 25 MG/ML IJ SOLN
6.2500 mg | INTRAMUSCULAR | Status: DC | PRN
  Filled 2017-12-19: qty 1

## 2017-12-19 MED ORDER — FENTANYL CITRATE (PF) 100 MCG/2ML IJ SOLN
INTRAMUSCULAR | Status: AC
  Filled 2017-12-19: qty 2

## 2017-12-19 MED ORDER — IBUPROFEN 800 MG PO TABS: 800.0000 mg | ORAL_TABLET | Freq: Three times a day (TID) | ORAL | 0 refills | Status: DC | PRN

## 2017-12-19 MED ORDER — ENSURE PRE-SURGERY PO LIQD
296.0000 mL | Freq: Once | ORAL | Status: DC
  Filled 2017-12-19: qty 296

## 2017-12-19 MED ORDER — EPHEDRINE 5 MG/ML INJ
INTRAVENOUS | Status: AC
  Filled 2017-12-19: qty 10

## 2017-12-19 MED ORDER — MIDAZOLAM HCL 2 MG/2ML IJ SOLN
2.0000 mg | Freq: Once | INTRAMUSCULAR | Status: AC
  Administered 2017-12-19: 2 mg via INTRAVENOUS
  Filled 2017-12-19: qty 2

## 2017-12-19 MED ORDER — CHLORHEXIDINE GLUCONATE CLOTH 2 % EX PADS
6.0000 | MEDICATED_PAD | Freq: Once | CUTANEOUS | Status: DC
  Filled 2017-12-19: qty 6

## 2017-12-19 MED ORDER — ROCURONIUM BROMIDE 10 MG/ML (PF) SYRINGE
PREFILLED_SYRINGE | INTRAVENOUS | Status: DC | PRN
  Administered 2017-12-19: 40 mg via INTRAVENOUS

## 2017-12-19 MED ORDER — ACETAMINOPHEN 160 MG/5ML PO SOLN
325.0000 mg | ORAL | Status: DC | PRN
  Filled 2017-12-19: qty 20.3

## 2017-12-19 MED ORDER — GABAPENTIN 300 MG PO CAPS
ORAL_CAPSULE | ORAL | Status: AC
  Filled 2017-12-19: qty 1

## 2017-12-19 MED ORDER — CELECOXIB 400 MG PO CAPS
400.0000 mg | ORAL_CAPSULE | ORAL | Status: AC
  Administered 2017-12-19: 400 mg via ORAL
  Filled 2017-12-19: qty 1

## 2017-12-19 MED ORDER — FENTANYL CITRATE (PF) 100 MCG/2ML IJ SOLN
25.0000 ug | INTRAMUSCULAR | Status: DC | PRN
  Filled 2017-12-19: qty 1

## 2017-12-19 MED ORDER — SUGAMMADEX SODIUM 200 MG/2ML IV SOLN
INTRAVENOUS | Status: AC
  Filled 2017-12-19: qty 2

## 2017-12-19 MED ORDER — OXYCODONE HCL 5 MG PO TABS
5.0000 mg | ORAL_TABLET | Freq: Once | ORAL | Status: DC | PRN
  Filled 2017-12-19: qty 1

## 2017-12-19 MED ORDER — EPHEDRINE SULFATE-NACL 50-0.9 MG/10ML-% IV SOSY
PREFILLED_SYRINGE | INTRAVENOUS | Status: DC | PRN
  Administered 2017-12-19 (×2): 10 mg via INTRAVENOUS

## 2017-12-19 MED ORDER — ONDANSETRON HCL 4 MG/2ML IJ SOLN
INTRAMUSCULAR | Status: AC
  Filled 2017-12-19: qty 2

## 2017-12-19 MED ORDER — OXYCODONE HCL 5 MG/5ML PO SOLN
5.0000 mg | Freq: Once | ORAL | Status: DC | PRN
  Filled 2017-12-19: qty 5

## 2017-12-19 MED ORDER — DEXAMETHASONE SODIUM PHOSPHATE 10 MG/ML IJ SOLN
INTRAMUSCULAR | Status: AC
  Filled 2017-12-19: qty 1

## 2017-12-19 MED ORDER — CEFAZOLIN SODIUM-DEXTROSE 2-4 GM/100ML-% IV SOLN
INTRAVENOUS | Status: AC
  Filled 2017-12-19: qty 100

## 2017-12-19 MED ORDER — FENTANYL CITRATE (PF) 100 MCG/2ML IJ SOLN
100.0000 ug | Freq: Once | INTRAMUSCULAR | Status: AC
  Administered 2017-12-19: 100 ug via INTRAVENOUS
  Filled 2017-12-19: qty 2

## 2017-12-19 MED ORDER — ONDANSETRON HCL 4 MG/2ML IJ SOLN
INTRAMUSCULAR | Status: DC | PRN
  Administered 2017-12-19: 4 mg via INTRAVENOUS

## 2017-12-19 MED ORDER — MIDAZOLAM HCL 2 MG/2ML IJ SOLN
INTRAMUSCULAR | Status: AC
  Filled 2017-12-19: qty 2

## 2017-12-19 MED ORDER — CELECOXIB 200 MG PO CAPS
ORAL_CAPSULE | ORAL | Status: AC
  Filled 2017-12-19: qty 1

## 2017-12-19 MED ORDER — PROPOFOL 10 MG/ML IV BOLUS
INTRAVENOUS | Status: DC | PRN
  Administered 2017-12-19: 150 mg via INTRAVENOUS

## 2017-12-19 MED ORDER — ACETAMINOPHEN 500 MG PO TABS
ORAL_TABLET | ORAL | Status: AC
  Filled 2017-12-19: qty 2

## 2017-12-19 MED ORDER — LIDOCAINE 2% (20 MG/ML) 5 ML SYRINGE
INTRAMUSCULAR | Status: DC | PRN
  Administered 2017-12-19: 100 mg via INTRAVENOUS

## 2017-12-19 MED ORDER — LACTATED RINGERS IV SOLN
INTRAVENOUS | Status: DC
  Administered 2017-12-19 (×2): via INTRAVENOUS
  Filled 2017-12-19: qty 1000

## 2017-12-19 MED ORDER — ARTIFICIAL TEARS OPHTHALMIC OINT
TOPICAL_OINTMENT | OPHTHALMIC | Status: AC
  Filled 2017-12-19: qty 3.5

## 2017-12-19 MED ORDER — BUPIVACAINE HCL (PF) 0.75 % IJ SOLN
INTRAMUSCULAR | Status: DC | PRN
  Administered 2017-12-19: 30 mL via PERINEURAL

## 2017-12-19 MED ORDER — SODIUM CHLORIDE 0.9 % IR SOLN
Status: DC | PRN
  Administered 2017-12-19: 500 mL

## 2017-12-19 MED ORDER — DEXAMETHASONE SODIUM PHOSPHATE 10 MG/ML IJ SOLN
INTRAMUSCULAR | Status: DC | PRN
  Administered 2017-12-19: 10 mg via INTRAVENOUS

## 2017-12-19 MED ORDER — ONDANSETRON HCL 4 MG/2ML IJ SOLN
4.0000 mg | Freq: Once | INTRAMUSCULAR | Status: DC | PRN
  Filled 2017-12-19: qty 2

## 2017-12-19 MED ORDER — LIDOCAINE HCL 4 % MT SOLN
OROMUCOSAL | Status: DC | PRN
  Administered 2017-12-19: 3 mL via TOPICAL

## 2017-12-19 SURGICAL SUPPLY — 48 items
ADH SKN CLS APL DERMABOND .7 (GAUZE/BANDAGES/DRESSINGS) ×2
BLADE CLIPPER SENSICLIP SURGIC (BLADE) ×3 IMPLANT
BLADE HEX COATED 2.75 (ELECTRODE) ×3 IMPLANT
BLADE SURG 15 STRL LF DISP TIS (BLADE) ×2 IMPLANT
BLADE SURG 15 STRL SS (BLADE) ×3
CANISTER SUCT 3000ML PPV (MISCELLANEOUS) IMPLANT
CELLS DAT CNTRL 66122 CELL SVR (MISCELLANEOUS) IMPLANT
CHLORAPREP W/TINT 26ML (MISCELLANEOUS) ×3 IMPLANT
COVER BACK TABLE 60X90IN (DRAPES) ×3 IMPLANT
COVER MAYO STAND STRL (DRAPES) ×3 IMPLANT
DERMABOND ADVANCED (GAUZE/BANDAGES/DRESSINGS) ×1
DERMABOND ADVANCED .7 DNX12 (GAUZE/BANDAGES/DRESSINGS) ×2 IMPLANT
DRAIN PENROSE 18X1/4 LTX STRL (WOUND CARE) IMPLANT
DRAPE LAPAROSCOPIC ABDOMINAL (DRAPES) ×3 IMPLANT
DRAPE UTILITY XL STRL (DRAPES) ×3 IMPLANT
ELECT REM PT RETURN 9FT ADLT (ELECTROSURGICAL) ×3
ELECTRODE REM PT RTRN 9FT ADLT (ELECTROSURGICAL) ×2 IMPLANT
GLOVE BIOGEL PI IND STRL 7.0 (GLOVE) ×2 IMPLANT
GLOVE BIOGEL PI INDICATOR 7.0 (GLOVE) ×1
GLOVE SURG SS PI 7.0 STRL IVOR (GLOVE) ×3 IMPLANT
GOWN STRL REUS W/ TWL LRG LVL3 (GOWN DISPOSABLE) ×4 IMPLANT
GOWN STRL REUS W/TWL LRG LVL3 (GOWN DISPOSABLE) ×6
KIT TURNOVER CYSTO (KITS) ×3 IMPLANT
MESH BARD SOFT 3X6IN (Mesh General) ×1 IMPLANT
NDL HYPO 25X1 1.5 SAFETY (NEEDLE) ×2 IMPLANT
NEEDLE HYPO 25X1 1.5 SAFETY (NEEDLE) ×3 IMPLANT
NS IRRIG 500ML POUR BTL (IV SOLUTION) ×3 IMPLANT
PACK BASIN DAY SURGERY FS (CUSTOM PROCEDURE TRAY) ×3 IMPLANT
PAD ARMBOARD 7.5X6 YLW CONV (MISCELLANEOUS) ×3 IMPLANT
PENCIL BUTTON HOLSTER BLD 10FT (ELECTRODE) ×3 IMPLANT
RETRACTOR WND ALEXIS 18 MED (MISCELLANEOUS) IMPLANT
RTRCTR WOUND ALEXIS 18CM MED (MISCELLANEOUS)
SPONGE LAP 4X18 RFD (DISPOSABLE) IMPLANT
SUT MNCRL AB 4-0 PS2 18 (SUTURE) ×3 IMPLANT
SUT PROLENE 2 0 CT2 30 (SUTURE) ×6 IMPLANT
SUT SILK 3 0 TIES 17X18 (SUTURE)
SUT SILK 3-0 18XBRD TIE BLK (SUTURE) IMPLANT
SUT VIC AB 2-0 SH 27 (SUTURE) ×3
SUT VIC AB 2-0 SH 27XBRD (SUTURE) ×2 IMPLANT
SUT VIC AB 3-0 SH 27 (SUTURE) ×3
SUT VIC AB 3-0 SH 27X BRD (SUTURE) ×2 IMPLANT
SUT VICRYL 2 0 18  UND BR (SUTURE) ×1
SUT VICRYL 2 0 18 UND BR (SUTURE) ×2 IMPLANT
SYR BULB IRRIGATION 50ML (SYRINGE) ×3 IMPLANT
SYR CONTROL 10ML LL (SYRINGE) ×3 IMPLANT
TOWEL OR 17X24 6PK STRL BLUE (TOWEL DISPOSABLE) ×3 IMPLANT
TUBE CONNECTING 12X1/4 (SUCTIONS) ×3 IMPLANT
YANKAUER SUCT BULB TIP NO VENT (SUCTIONS) ×3 IMPLANT

## 2017-12-19 NOTE — Progress Notes (Signed)
0654 time out with Dr. Tacy Duraddono and nurse for block. Versed 2mg  and Fentanyl 100 mcg given for sedation. [id number] block completed.

## 2017-12-19 NOTE — Op Note (Signed)
Preop diagnosis: left inguinal hernia  Postop diagnosis: left indirect inguinal hernia  Procedure: open Left inguinal hernia repair with mesh  Surgeon: Dale Wagner, M.D.  Asst: none  Anesthesia: Gen.   Indications for procedure: Dale Wagner is a 62 y.o. male with symptoms of pain and enlarging Left inguinal hernia(s). After discussing risks, alternatives and benefits he decided on open repair and was brought to day surgery for repair.  Description of procedure: The patient was brought into the operative suite, placed supine. Anesthesia was administered with endotracheal tube. Patient was strapped in place. The patient was prepped and draped in the usual sterile fashion.  The anterior superior iliac spine and pubic tubercle were identified on the Left side. An incision was made 1cm above the connecting line, representative of the location of the inguinal ligament. The subcutaneous tissue was bluntly dissected, scarpa's fascia was dissected away. The external abdominal oblique fascia was identified and sharply opened down to the external inguinal ring. The conjoint tendon and inguinal ligament were identified. The cord structures and sac were dissected free of the surrounding tissue in 360 degrees. A penrose drain was used to encircle the contents. The cremasteric fibers were dissected free of the contents of the cord and hernia sac. The cord structures (vessels and vas deferens) were identified and carefully dissected away from the hernia sac. The hernia sac was moderate sized and indirect. The hernia sac was dissected down to the internal inguinal ring. Preperitoneal fat was identified showing appropriate dissection. The sac was ligated with a 3-0 vicryl. The sac was then reduced into the preperitoneal space. The ilioinguinal nerve was identified, cut and tied with a 3-0 vicryl. A 3x6 Bard Soft mesh was then used to close the defect and reinforce the floor. The mesh was sutured to the  lacunar ligament and inguinal ligament using a 2-0 prolene in running fashion. Next the superior edge of the mesh was sutured to the conjoined tendon using a 2-0 running Prolene. An additional 2-0 Prolene was used to suture the tail ends of the mesh together re-creating the deep ring. Cord structures are running in a neutral position through the mesh. Next the external abdominal oblique fascia was closed with a 2-0 Vicryl in running fashion to re-create the external inguinal ring. Scarpa's fascia was closed with 3-0 Vicryl in running fashion. Skin was closed with a 4-0 Monocryl subcuticular stitch in running fashion. Dermabond place for dressing. Patient woke from anesthesia and brought to PACU in stable condition. All counts are correct.    Findings: left indirect inguinal hernia  Specimen: none  Blood loss: 30 ml  Local anesthesia: TAP preoperative block  Complications: none  Implant: 3x6in bard soft mesh  Dale Wagner, M.D. General, Bariatric, & Minimally Invasive Surgery [facility], [place] 8:42 AM 12/19/2017

## 2017-12-19 NOTE — Anesthesia Procedure Notes (Addendum)
Procedure Name: Intubation Date/Time: 12/19/2017 7:36 AM Performed by: Janeece Riggers, MD Pre-anesthesia Checklist: Patient identified, Emergency Drugs available, Suction available and Patient being monitored Patient Re-evaluated:Patient Re-evaluated prior to induction Oxygen Delivery Method: Circle system utilized Preoxygenation: Pre-oxygenation with 100% oxygen Induction Type: IV induction Ventilation: Mask ventilation without difficulty Laryngoscope Size: Mac and 4 Grade View: Grade I Tube type: Oral Tube size: 8.0 mm Number of attempts: 1 Airway Equipment and Method: Stylet and LTA kit utilized Placement Confirmation: ETT inserted through vocal cords under direct vision,  positive ETCO2 and breath sounds checked- equal and bilateral Secured at: 22 cm Tube secured with: Tape Dental Injury: Teeth and Oropharynx as per pre-operative assessment

## 2017-12-19 NOTE — Transfer of Care (Signed)
Immediate Anesthesia Transfer of Care Note  Patient: Dale Wagner  Procedure(s) Performed: Procedure(s) (LRB): LEFT OPEN INGUINAL HERNIA REPAIR (Left) INSERTION OF MESH (N/A)  Patient Location: PACU  Anesthesia Type: General  Level of Consciousness: awake, alert  and oriented  Airway & Oxygen Therapy: Patient Spontanous Breathing and Patient connected to face mask oxygen  Post-op Assessment: Report given to PACU RN and Post -op Vital signs reviewed and stable  Post vital signs: Reviewed and stable  Complications: No apparent anesthesia complications Last Vitals:  Vitals Value Taken Time  BP    Temp    Pulse    Resp    SpO2      Last Pain:  Vitals:   12/19/17 1100  TempSrc:   PainSc: 1       Patients Stated Pain Goal: 5 (12/19/17 0540)

## 2017-12-19 NOTE — H&P (Signed)
Dale Wagner is an 62 y.o. male.   Chief Complaint: inguinal hernia HPI: 62 yo male with left inguinal hernia causing intermittent pains. He denies nausea or vomiting.  Past Medical History:  Diagnosis Date  . Arthritis   . GERD (gastroesophageal reflux disease)   . Left inguinal hernia   . Wears glasses     Past Surgical History:  Procedure Laterality Date  . INGUINAL HERNIA REPAIR Right 2007  APPROX.  Marland Kitchen. TOTAL HIP ARTHROPLASTY Left 07/12/2015   Procedure: LEFT TOTAL HIP ARTHROPLASTY ANTERIOR APPROACH;  Surgeon: Durene RomansMatthew Olin, MD;  Location: [facility];  Service: Orthopedics;  Laterality: Left;    History reviewed. No pertinent family history. Social History:  reports that he has never smoked. He has never used smokeless tobacco. He reports that he does not drink alcohol or use drugs.  Allergies: No Known Allergies  Medications Prior to Admission  Medication Sig Dispense Refill  . esomeprazole (NEXIUM) 20 MG capsule Take 20 mg by mouth as needed.      No results found for this or any previous visit (from the past 48 hour(s)). No results found.  Review of Systems  Constitutional: Negative for chills and fever.  HENT: Negative for hearing loss.   Eyes: Negative for blurred vision and double vision.  Respiratory: Negative for cough and hemoptysis.   Cardiovascular: Negative for chest pain and palpitations.  Gastrointestinal: Negative for abdominal pain, nausea and vomiting.  Genitourinary: Negative for dysuria and urgency.  Musculoskeletal: Negative for myalgias and neck pain.  Skin: Negative for itching and rash.  Neurological: Negative for dizziness, tingling and headaches.  Endo/Heme/Allergies: Does not bruise/bleed easily.  Psychiatric/Behavioral: Negative for depression and suicidal ideas.    Blood pressure 101/63, pulse (!) 49, temperature 97.6 F (36.4 C), temperature source Oral, resp. rate (!) 9, height 5' 9.5" (1.765 m), weight 79 kg (174 lb 3.2 oz), SpO2 100  %. Physical Exam  Vitals reviewed. Constitutional: He is oriented to person, place, and time. He appears well-developed and well-nourished.  HENT:  Head: Normocephalic and atraumatic.  Eyes: Pupils are equal, round, and reactive to light. Conjunctivae and EOM are normal.  Neck: Normal range of motion. Neck supple.  Cardiovascular: Normal rate and regular rhythm.  Respiratory: Effort normal and breath sounds normal.  GI: Soft. Bowel sounds are normal. He exhibits no distension. There is no tenderness.  Left inguinal hernia  Musculoskeletal: Normal range of motion.  Neurological: He is alert and oriented to person, place, and time.  Skin: Skin is warm and dry.  Psychiatric: He has a normal mood and affect. His behavior is normal.     Assessment/Plan 62 yo male with left inguinal hernia -open left inguinal hernia repair with mesh -planned outpatient procedure  Rodman PickleLuke Aaron Kinsinger, MD 12/19/2017, 7:07 AM

## 2017-12-19 NOTE — Anesthesia Postprocedure Evaluation (Signed)
Anesthesia Post Note  Patient: Dale Wagner  Procedure(s) Performed: LEFT OPEN INGUINAL HERNIA REPAIR (Left ) INSERTION OF MESH (N/A )     Patient location during evaluation: PACU Anesthesia Type: General Level of consciousness: awake and alert Pain management: pain level controlled Vital Signs Assessment: post-procedure vital signs reviewed and stable Respiratory status: spontaneous breathing, nonlabored ventilation, respiratory function stable and patient connected to nasal cannula oxygen Cardiovascular status: blood pressure returned to baseline and stable Postop Assessment: no apparent nausea or vomiting Anesthetic complications: no    Last Vitals:  Vitals:   12/19/17 0930 12/19/17 1100  BP: 114/60 (!) 126/59  Pulse: (!) 51 (!) 49  Resp: 15 16  Temp:  36.5 C  SpO2: 100% 100%    Last Pain:  Vitals:   12/19/17 1100  TempSrc:   PainSc: 1                  Madge Therrien

## 2017-12-19 NOTE — Anesthesia Procedure Notes (Signed)
Anesthesia Regional Block: TAP block   Pre-Anesthetic Checklist: ,, timeout performed, Correct Patient, Correct Site, Correct Laterality, Correct Procedure, Correct Position, site marked, Risks and benefits discussed,  Surgical consent,  Pre-op evaluation,  At surgeon's request and post-op pain management  Laterality: Left  Prep: chloraprep       Needles:  Injection technique: Single-shot  Needle Type: Echogenic Stimulator Needle     Needle Length: 5cm  Needle Gauge: 22     Additional Needles:   Procedures:, nerve stimulator,,, ultrasound used (permanent image in chart),,,,  Narrative:  Start time: 12/19/2017 6:55 AM End time: 12/19/2017 7:00 AM Injection made incrementally with aspirations every 5 mL.  Performed by: Personally  Anesthesiologist: Bethena Midgetddono, Rivan Siordia, MD  Additional Notes: Functioning IV was confirmed and monitors were applied.  A 50mm 22ga Arrow echogenic stimulator needle was used. Sterile prep and drape,hand hygiene and sterile gloves were used. Ultrasound guidance: relevant anatomy identified, needle position confirmed, local anesthetic spread visualized around nerve(s)., vascular puncture avoided.  Image printed for medical record. Negative aspiration and negative test dose prior to incremental administration of local anesthetic. The patient tolerated the procedure well.

## 2017-12-19 NOTE — Discharge Instructions (Signed)

## 2017-12-20 ENCOUNTER — Encounter (HOSPITAL_BASED_OUTPATIENT_CLINIC_OR_DEPARTMENT_OTHER): Payer: Self-pay | Admitting: General Surgery

## 2019-06-28 ENCOUNTER — Ambulatory Visit: Payer: PPO | Attending: Internal Medicine

## 2019-06-28 ENCOUNTER — Ambulatory Visit: Payer: Commercial Managed Care - PPO

## 2019-06-28 DIAGNOSIS — Z23 Encounter for immunization: Secondary | ICD-10-CM

## 2019-06-28 NOTE — Progress Notes (Signed)
   Covid-19 Vaccination Clinic  Name:  Dale Wagner    MRN: [id number] DOB: 1956-05-03  06/28/2019  [name] was observed post Covid-19 immunization for 15 minutes without incidence. He was provided with Vaccine Information Sheet and instruction to access the V-Safe system.   [name] was instructed to call 911 with any severe reactions post vaccine: Marland Kitchen Difficulty breathing  . Swelling of your face and throat  . A fast heartbeat  . A bad rash all over your body  . Dizziness and weakness    Immunizations Administered    Name Date Dose VIS Date Route   Pfizer COVID-19 Vaccine 06/28/2019  2:54 PM 0.3 mL 05/15/2019 Intramuscular   Manufacturer: [employer], [employer]   Lot: [id number]   NDC: [address]-9

## 2019-07-20 ENCOUNTER — Ambulatory Visit: Payer: PPO | Attending: Internal Medicine

## 2019-07-20 DIAGNOSIS — Z23 Encounter for immunization: Secondary | ICD-10-CM | POA: Insufficient documentation

## 2019-07-20 NOTE — Progress Notes (Signed)
   Covid-19 Vaccination Clinic  Name:  Dale Wagner    MRN: [id number] DOB: May 28, 1956  07/20/2019  [name] was observed post Covid-19 immunization for 15 minutes without incidence. He was provided with Vaccine Information Sheet and instruction to access the V-Safe system.   [name] was instructed to call 911 with any severe reactions post vaccine: Marland Kitchen Difficulty breathing  . Swelling of your face and throat  . A fast heartbeat  . A bad rash all over your body  . Dizziness and weakness    Immunizations Administered    Name Date Dose VIS Date Route   Pfizer COVID-19 Vaccine 07/20/2019  8:58 AM 0.3 mL 05/15/2019 Intramuscular   Manufacturer: [employer], [employer]   Lot: [id number]   NDC: [address]-4

## 2019-12-14 DIAGNOSIS — R799 Abnormal finding of blood chemistry, unspecified: Secondary | ICD-10-CM | POA: Diagnosis not present

## 2019-12-14 DIAGNOSIS — Z0000 Encounter for general adult medical examination without abnormal findings: Secondary | ICD-10-CM | POA: Diagnosis not present

## 2019-12-14 DIAGNOSIS — E782 Mixed hyperlipidemia: Secondary | ICD-10-CM | POA: Diagnosis not present

## 2019-12-14 DIAGNOSIS — Z23 Encounter for immunization: Secondary | ICD-10-CM | POA: Diagnosis not present

## 2019-12-14 DIAGNOSIS — Z125 Encounter for screening for malignant neoplasm of prostate: Secondary | ICD-10-CM | POA: Diagnosis not present

## 2019-12-14 DIAGNOSIS — I4891 Unspecified atrial fibrillation: Secondary | ICD-10-CM | POA: Diagnosis not present

## 2020-01-07 ENCOUNTER — Ambulatory Visit: Payer: PPO | Admitting: Cardiovascular Disease

## 2020-02-15 ENCOUNTER — Other Ambulatory Visit: Payer: Self-pay

## 2020-02-15 ENCOUNTER — Encounter: Payer: Self-pay | Admitting: Cardiovascular Disease

## 2020-02-15 ENCOUNTER — Ambulatory Visit: Payer: PPO | Admitting: Cardiovascular Disease

## 2020-02-15 VITALS — BP 134/74 | HR 56 | Ht 71.0 in | Wt 188.0 lb

## 2020-02-15 DIAGNOSIS — I480 Paroxysmal atrial fibrillation: Secondary | ICD-10-CM | POA: Diagnosis not present

## 2020-02-15 NOTE — Progress Notes (Signed)
02/15/2020 Dale Wagner   04-Feb-1956  [id number]  Primary Physician Soundra Pilon, FNP Primary Cardiologist: Runell Gess MD [place], [place]  HPI:  Dale Wagner is a 64 y.o. 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  He was referred by his PCP, Micheline Maze, FNP, for evaluation of asymptomatic PAF.  He has no cardiac risk factors other than family history the father who died of a myocardial infarction at age 58 although he was a smoker peers never had heart attack stroke.  Denies chest pain or shortness of breath.  He recently Medicare and had a routine physical the showed that he was in A. fib with a slow ventricular response although he was unaware with this.  He was not placed on oral anticoagulation because of lack of risk factors.   Current Meds  Medication Sig  . aspirin EC 325 MG tablet Take 325 mg by mouth daily.     No Known Allergies  Social History   Socioeconomic History  . Marital status: Married    Spouse name: Not on file  . Number of children: Not on file  . Years of education: Not on file  . Highest education level: Not on file  Occupational History  . Not on file  Tobacco Use  . Smoking status: Never Smoker  . Smokeless tobacco: Never Used  Vaping Use  . Vaping Use: Never used  Substance and Sexual Activity  . Alcohol use: No  . Drug use: No  . Sexual activity: Not on file  Other Topics Concern  . Not on file  Social History Narrative  . Not on file   Social Determinants of Health   Financial Resource Strain:   . Difficulty of Paying Living Expenses: Not on file  Food Insecurity:   . Worried About Programme researcher, broadcasting/film/video in the Last Year: Not on file  . Ran Out of Food in the Last Year: Not on file  Transportation Needs:   . Lack of Transportation (Medical): Not on file  . Lack of  Transportation (Non-Medical): Not on file  Physical Activity:   . Days of Exercise per Week: Not on file  . Minutes of Exercise per Session: Not on file  Stress:   . Feeling of Stress : Not on file  Social Connections:   . Frequency of Communication with Friends and Family: Not on file  . Frequency of Social Gatherings with Friends and Family: Not on file  . Attends Religious Services: Not on file  . Active Member of Clubs or Organizations: Not on file  . Attends Banker Meetings: Not on file  . Marital Status: Not on file  Intimate Partner Violence:   . Fear of Current or Ex-Partner: Not on file  . Emotionally Abused: Not on file  . Physically Abused: Not on file  . Sexually Abused: Not on file     Review of Systems: General: negative for chills, fever, night sweats or weight changes.  Cardiovascular: negative for chest pain, dyspnea on exertion, edema, orthopnea, palpitations, paroxysmal nocturnal dyspnea or shortness of breath Dermatological: negative for rash Respiratory: negative for cough or wheezing Urologic: negative for hematuria Abdominal: negative for nausea, vomiting, diarrhea, bright red blood per rectum, melena, or hematemesis Neurologic: negative for visual changes, syncope, or dizziness All other systems  reviewed and are otherwise negative except as noted above.    Blood pressure 134/74, pulse (!) 56, height 5\' 11"  (1.803 m), weight 188 lb (85.3 kg).  General appearance: alert and no distress Neck: no adenopathy, no carotid bruit, no JVD, supple, symmetrical, trachea midline and thyroid not enlarged, symmetric, no tenderness/mass/nodules Lungs: clear to auscultation bilaterally Heart: regular rate and rhythm, S1, S2 normal, no murmur, click, rub or gallop Extremities: extremities normal, atraumatic, no cyanosis or edema Pulses: 2+ and symmetric Skin: Skin color, texture, turgor normal. No rashes or lesions Neurologic: Alert and oriented X 3, normal  strength and tone. Normal symmetric reflexes. Normal coordination and gait  EKG sinus bradycardia 56 without ST or T wave changes.  I personally reviewed this EKG.  ASSESSMENT AND PLAN:   PAF (paroxysmal atrial fibrillation) (HCC) History of asymptomatic PAF seen on EKG performed for routine "Medicare visit" on 12/14/2019.  He was unaware that he was in A. Fib. The CHA2DSVASC2 score is  1 .  There is no indication for oral anticoagulation.  He is on a baby aspirin.  I am to get a 2D echocardiogram.      02/14/2020 MD [facility], [facility] 02/15/2020 9:12 AM

## 2020-02-15 NOTE — Assessment & Plan Note (Signed)
History of asymptomatic PAF seen on EKG performed for routine "Medicare visit" on 12/14/2019.  He was unaware that he was in A. Fib. The CHA2DSVASC2 score is  1 .  There is no indication for oral anticoagulation.  He is on a baby aspirin.  I am to get a 2D echocardiogram.

## 2020-02-15 NOTE — Patient Instructions (Signed)
Medication Instructions:  Your physician recommends that you continue on your current medications as directed. Please refer to the Current Medication list given to you today.  *If you need a refill on your cardiac medications before your next appointment, please call your pharmacy*   Lab Work: NONE ordered at this time of appointment   If you have labs (blood work) drawn today and your tests are completely normal, you will receive your results only by: Marland Kitchen MyChart Message (if you have MyChart) OR . A paper copy in the mail If you have any lab test that is abnormal or we need to change your treatment, we will call you to review the results.   Testing/Procedures: Your physician has requested that you have an echocardiogram. Echocardiography is a painless test that uses sound waves to create images of your heart. It provides your doctor with information about the size and shape of your heart and how well your heart's chambers and valves are working. This procedure takes approximately one hour. There are no restrictions for this procedure. This test is perfomed at [address]   Please schedule for 3-4 weeks   Follow-Up: At [facility], you and your health needs are our priority.  As part of our continuing mission to provide you with exceptional heart care, we have created designated Provider Care Teams.  These Care Teams include your primary Cardiologist (physician) and Advanced Practice Providers (APPs -  Physician Assistants and Nurse Practitioners) who all work together to provide you with the care you need, when you need it.  Your next appointment:   As Needed   The format for your next appointment:   In Person  Provider:   You may see Nanetta Batty, MD or one of the following Advanced Practice Providers on your designated Care Team:    Corine Shelter, PA-C  [place], [place]  Edd Fabian, [place]  Other Instructions

## 2020-03-16 ENCOUNTER — Other Ambulatory Visit (HOSPITAL_COMMUNITY): Payer: PPO

## 2020-03-31 ENCOUNTER — Other Ambulatory Visit: Payer: Self-pay

## 2020-03-31 ENCOUNTER — Ambulatory Visit (HOSPITAL_COMMUNITY): Payer: PPO | Attending: Cardiovascular Disease

## 2020-03-31 DIAGNOSIS — I480 Paroxysmal atrial fibrillation: Secondary | ICD-10-CM | POA: Diagnosis not present

## 2020-03-31 LAB — ECHOCARDIOGRAM COMPLETE
Area-P 1/2: 4.47 cm2
P 1/2 time: 718 msec
S' Lateral: 3.7 cm

## 2020-12-13 DIAGNOSIS — I4891 Unspecified atrial fibrillation: Secondary | ICD-10-CM | POA: Diagnosis not present

## 2020-12-13 DIAGNOSIS — Z131 Encounter for screening for diabetes mellitus: Secondary | ICD-10-CM | POA: Diagnosis not present

## 2020-12-13 DIAGNOSIS — Z0000 Encounter for general adult medical examination without abnormal findings: Secondary | ICD-10-CM | POA: Diagnosis not present

## 2020-12-13 DIAGNOSIS — Z5320 Procedure and treatment not carried out because of patient's decision for unspecified reasons: Secondary | ICD-10-CM | POA: Diagnosis not present

## 2020-12-13 DIAGNOSIS — E782 Mixed hyperlipidemia: Secondary | ICD-10-CM | POA: Diagnosis not present

## 2020-12-13 DIAGNOSIS — Z125 Encounter for screening for malignant neoplasm of prostate: Secondary | ICD-10-CM | POA: Diagnosis not present

## 2020-12-20 DIAGNOSIS — I480 Paroxysmal atrial fibrillation: Secondary | ICD-10-CM | POA: Diagnosis not present

## 2020-12-20 DIAGNOSIS — Z0000 Encounter for general adult medical examination without abnormal findings: Secondary | ICD-10-CM | POA: Diagnosis not present

## 2020-12-20 DIAGNOSIS — Z125 Encounter for screening for malignant neoplasm of prostate: Secondary | ICD-10-CM | POA: Diagnosis not present

## 2020-12-20 DIAGNOSIS — E782 Mixed hyperlipidemia: Secondary | ICD-10-CM | POA: Diagnosis not present

## 2022-12-11 DIAGNOSIS — I480 Paroxysmal atrial fibrillation: Secondary | ICD-10-CM | POA: Diagnosis not present

## 2022-12-11 DIAGNOSIS — E782 Mixed hyperlipidemia: Secondary | ICD-10-CM | POA: Diagnosis not present

## 2022-12-11 DIAGNOSIS — Z0000 Encounter for general adult medical examination without abnormal findings: Secondary | ICD-10-CM | POA: Diagnosis not present

## 2022-12-11 DIAGNOSIS — R600 Localized edema: Secondary | ICD-10-CM | POA: Diagnosis not present

## 2022-12-11 DIAGNOSIS — Z125 Encounter for screening for malignant neoplasm of prostate: Secondary | ICD-10-CM | POA: Diagnosis not present

## 2022-12-11 DIAGNOSIS — R351 Nocturia: Secondary | ICD-10-CM | POA: Diagnosis not present

## 2023-02-27 DIAGNOSIS — E782 Mixed hyperlipidemia: Secondary | ICD-10-CM | POA: Diagnosis not present

## 2023-02-27 DIAGNOSIS — R600 Localized edema: Secondary | ICD-10-CM | POA: Diagnosis not present

## 2023-04-24 DIAGNOSIS — R0981 Nasal congestion: Secondary | ICD-10-CM | POA: Diagnosis not present

## 2023-04-24 DIAGNOSIS — R051 Acute cough: Secondary | ICD-10-CM | POA: Diagnosis not present

## 2023-04-24 DIAGNOSIS — J019 Acute sinusitis, unspecified: Secondary | ICD-10-CM | POA: Diagnosis not present

## 2023-11-02 DIAGNOSIS — I480 Paroxysmal atrial fibrillation: Secondary | ICD-10-CM | POA: Diagnosis not present

## 2023-11-02 DIAGNOSIS — E782 Mixed hyperlipidemia: Secondary | ICD-10-CM | POA: Diagnosis not present

## 2023-12-02 DIAGNOSIS — I480 Paroxysmal atrial fibrillation: Secondary | ICD-10-CM | POA: Diagnosis not present

## 2023-12-02 DIAGNOSIS — E782 Mixed hyperlipidemia: Secondary | ICD-10-CM | POA: Diagnosis not present

## 2024-01-02 DIAGNOSIS — E782 Mixed hyperlipidemia: Secondary | ICD-10-CM | POA: Diagnosis not present

## 2024-01-02 DIAGNOSIS — I480 Paroxysmal atrial fibrillation: Secondary | ICD-10-CM | POA: Diagnosis not present

## 2024-02-02 DIAGNOSIS — I480 Paroxysmal atrial fibrillation: Secondary | ICD-10-CM | POA: Diagnosis not present

## 2024-02-02 DIAGNOSIS — E782 Mixed hyperlipidemia: Secondary | ICD-10-CM | POA: Diagnosis not present

## 2024-03-03 DIAGNOSIS — I480 Paroxysmal atrial fibrillation: Secondary | ICD-10-CM | POA: Diagnosis not present

## 2024-03-03 DIAGNOSIS — E782 Mixed hyperlipidemia: Secondary | ICD-10-CM | POA: Diagnosis not present

## 2024-04-03 DIAGNOSIS — I480 Paroxysmal atrial fibrillation: Secondary | ICD-10-CM | POA: Diagnosis not present

## 2024-04-03 DIAGNOSIS — E782 Mixed hyperlipidemia: Secondary | ICD-10-CM | POA: Diagnosis not present
